# Patient Record
Sex: Female | Born: 1960 | Race: White | Hispanic: No | Smoking: Never smoker
Health system: Southern US, Community
[De-identification: ages and names within clinical notes are randomized; demographics above are authoritative.]

## PROBLEM LIST (undated history)

## (undated) DIAGNOSIS — I83893 Varicose veins of bilateral lower extremities with other complications: Secondary | ICD-10-CM

## (undated) DIAGNOSIS — G43909 Migraine, unspecified, not intractable, without status migrainosus: Secondary | ICD-10-CM

## (undated) DIAGNOSIS — N951 Menopausal and female climacteric states: Secondary | ICD-10-CM

## (undated) DIAGNOSIS — I83813 Varicose veins of bilateral lower extremities with pain: Secondary | ICD-10-CM

## (undated) DIAGNOSIS — N9489 Other specified conditions associated with female genital organs and menstrual cycle: Secondary | ICD-10-CM

## (undated) DIAGNOSIS — E785 Hyperlipidemia, unspecified: Secondary | ICD-10-CM

## (undated) DIAGNOSIS — L130 Dermatitis herpetiformis: Secondary | ICD-10-CM

## (undated) DIAGNOSIS — G4700 Insomnia, unspecified: Secondary | ICD-10-CM

## (undated) DIAGNOSIS — D649 Anemia, unspecified: Secondary | ICD-10-CM

## (undated) DIAGNOSIS — M8580 Other specified disorders of bone density and structure, unspecified site: Secondary | ICD-10-CM

## (undated) DIAGNOSIS — N200 Calculus of kidney: Secondary | ICD-10-CM

## (undated) DIAGNOSIS — J329 Chronic sinusitis, unspecified: Secondary | ICD-10-CM

## (undated) DIAGNOSIS — N3010 Interstitial cystitis (chronic) without hematuria: Secondary | ICD-10-CM

## (undated) HISTORY — DX: Other specified disorders of bone density and structure, unspecified site: M85.80

## (undated) HISTORY — DX: Insomnia, unspecified: G47.00

## (undated) HISTORY — DX: Menopausal and female climacteric states: N95.1

## (undated) HISTORY — DX: Calculus of kidney: N20.0

## (undated) HISTORY — PX: VEIN LIGATION AND STRIPPING: SHX2653

## (undated) HISTORY — DX: Hyperlipidemia, unspecified: E78.5

## (undated) HISTORY — DX: Anemia, unspecified: D64.9

## (undated) HISTORY — PX: VITRECTOMY: SHX106

## (undated) HISTORY — DX: Other specified conditions associated with female genital organs and menstrual cycle: N94.89

## (undated) HISTORY — DX: Migraine, unspecified, not intractable, without status migrainosus: G43.909

## (undated) HISTORY — PX: BREAST EXCISIONAL BIOPSY: SUR124

## (undated) HISTORY — PX: LAPAROSCOPIC OVARIAN: SHX5906

## (undated) HISTORY — PX: OTHER SURGICAL HISTORY: SHX169

## (undated) HISTORY — DX: Dermatitis herpetiformis: L13.0

## (undated) HISTORY — DX: Varicose veins of bilateral lower extremities with pain: I83.813

## (undated) HISTORY — DX: Chronic sinusitis, unspecified: J32.9

## (undated) HISTORY — DX: Varicose veins of bilateral lower extremities with other complications: I83.893

---

## 2012-08-19 DIAGNOSIS — R3915 Urgency of urination: Secondary | ICD-10-CM | POA: Insufficient documentation

## 2012-08-19 DIAGNOSIS — R350 Frequency of micturition: Secondary | ICD-10-CM | POA: Insufficient documentation

## 2012-08-19 DIAGNOSIS — R351 Nocturia: Secondary | ICD-10-CM | POA: Insufficient documentation

## 2012-09-16 DIAGNOSIS — R3589 Other polyuria: Secondary | ICD-10-CM | POA: Insufficient documentation

## 2012-09-16 DIAGNOSIS — R358 Other polyuria: Secondary | ICD-10-CM | POA: Insufficient documentation

## 2013-05-28 ENCOUNTER — Other Ambulatory Visit: Payer: Self-pay | Admitting: Family Medicine

## 2013-05-28 DIAGNOSIS — M79606 Pain in leg, unspecified: Secondary | ICD-10-CM

## 2013-05-28 DIAGNOSIS — M7989 Other specified soft tissue disorders: Secondary | ICD-10-CM

## 2013-06-04 ENCOUNTER — Ambulatory Visit
Admission: RE | Admit: 2013-06-04 | Discharge: 2013-06-04 | Disposition: A | Discharge status: Home / Self Care | Payer: No Typology Code available for payment source | Source: Ambulatory Visit | Attending: Family Medicine | Admitting: Family Medicine

## 2013-06-04 DIAGNOSIS — M7989 Other specified soft tissue disorders: Secondary | ICD-10-CM

## 2013-06-04 DIAGNOSIS — M79606 Pain in leg, unspecified: Secondary | ICD-10-CM

## 2014-04-18 ENCOUNTER — Emergency Department (HOSPITAL_COMMUNITY)
Admission: EM | Admit: 2014-04-18 | Discharge: 2014-04-18 | Disposition: A | Discharge status: Home / Self Care | Payer: BC Managed Care – PPO | Attending: Emergency Medicine | Admitting: Emergency Medicine

## 2014-04-18 ENCOUNTER — Encounter (HOSPITAL_COMMUNITY): Payer: Self-pay | Admitting: *Deleted

## 2014-04-18 ENCOUNTER — Emergency Department (HOSPITAL_COMMUNITY): Payer: BC Managed Care – PPO

## 2014-04-18 DIAGNOSIS — R109 Unspecified abdominal pain: Secondary | ICD-10-CM | POA: Diagnosis present

## 2014-04-18 DIAGNOSIS — N201 Calculus of ureter: Secondary | ICD-10-CM | POA: Diagnosis not present

## 2014-04-18 HISTORY — DX: Interstitial cystitis (chronic) without hematuria: N30.10

## 2014-04-18 LAB — CBC WITH DIFFERENTIAL/PLATELET
Basophils Absolute: 0.1 10*3/uL (ref 0.0–0.1)
Basophils Relative: 1 % (ref 0–1)
Eosinophils Absolute: 0.2 10*3/uL (ref 0.0–0.7)
Eosinophils Relative: 2 % (ref 0–5)
HCT: 38.8 % (ref 36.0–46.0)
HEMOGLOBIN: 12.5 g/dL (ref 12.0–15.0)
LYMPHS ABS: 1.2 10*3/uL (ref 0.7–4.0)
LYMPHS PCT: 15 % (ref 12–46)
MCH: 30.3 pg (ref 26.0–34.0)
MCHC: 32.2 g/dL (ref 30.0–36.0)
MCV: 93.9 fL (ref 78.0–100.0)
MONOS PCT: 9 % (ref 3–12)
Monocytes Absolute: 0.7 10*3/uL (ref 0.1–1.0)
Neutro Abs: 5.9 10*3/uL (ref 1.7–7.7)
Neutrophils Relative %: 73 % (ref 43–77)
PLATELETS: 200 10*3/uL (ref 150–400)
RBC: 4.13 MIL/uL (ref 3.87–5.11)
RDW: 12.9 % (ref 11.5–15.5)
WBC: 8 10*3/uL (ref 4.0–10.5)

## 2014-04-18 LAB — COMPREHENSIVE METABOLIC PANEL
ALBUMIN: 4.2 g/dL (ref 3.5–5.2)
ALT: 15 U/L (ref 0–35)
ANION GAP: 13 (ref 5–15)
AST: 20 U/L (ref 0–37)
Alkaline Phosphatase: 56 U/L (ref 39–117)
BUN: 20 mg/dL (ref 6–23)
CALCIUM: 9.6 mg/dL (ref 8.4–10.5)
CO2: 27 mEq/L (ref 19–32)
CREATININE: 0.64 mg/dL (ref 0.50–1.10)
Chloride: 105 mEq/L (ref 96–112)
GFR calc Af Amer: >90 mL/min (ref >90)
GFR calc non Af Amer: >90 mL/min (ref >90)
Glucose, Bld: 96 mg/dL (ref 70–99)
Potassium: 4.2 mEq/L (ref 3.7–5.3)
Sodium: 145 mEq/L (ref 137–147)
TOTAL PROTEIN: 7.2 g/dL (ref 6.0–8.3)
Total Bilirubin: 0.4 mg/dL (ref 0.3–1.2)

## 2014-04-18 LAB — URINALYSIS, ROUTINE W REFLEX MICROSCOPIC
BILIRUBIN URINE: NEGATIVE
Glucose, UA: NEGATIVE mg/dL
Hgb urine dipstick: NEGATIVE
Ketones, ur: NEGATIVE mg/dL
Leukocytes, UA: NEGATIVE
Nitrite: NEGATIVE
PH: 8 (ref 5.0–8.0)
Protein, ur: NEGATIVE mg/dL
SPECIFIC GRAVITY, URINE: 1.017 (ref 1.005–1.030)
UROBILINOGEN UA: 0.2 mg/dL (ref 0.0–1.0)

## 2014-04-18 LAB — URINE MICROSCOPIC-ADD ON

## 2014-04-18 LAB — I-STAT BETA HCG BLOOD, ED (MC, WL, AP ONLY)

## 2014-04-18 LAB — LIPASE, BLOOD: Lipase: 45 U/L (ref 11–59)

## 2014-04-18 MED ORDER — ONDANSETRON HCL 4 MG PO TABS: 4.0000 mg | ORAL_TABLET | Freq: Four times a day (QID) | ORAL | Status: DC

## 2014-04-18 MED ORDER — ONDANSETRON HCL 4 MG/2ML IJ SOLN
4.0000 mg | Freq: Once | INTRAMUSCULAR | Status: AC
  Administered 2014-04-18: 4 mg via INTRAVENOUS
  Filled 2014-04-18: qty 2

## 2014-04-18 MED ORDER — SODIUM CHLORIDE 0.9 % IV SOLN
1000.0000 mL | Freq: Once | INTRAVENOUS | Status: AC
  Administered 2014-04-18: 1000 mL via INTRAVENOUS

## 2014-04-18 MED ORDER — SODIUM CHLORIDE 0.9 % IV SOLN
1000.0000 mL | INTRAVENOUS | Status: DC
  Administered 2014-04-18: 1000 mL via INTRAVENOUS

## 2014-04-18 MED ORDER — HYDROMORPHONE HCL 1 MG/ML IJ SOLN
1.0000 mg | INTRAMUSCULAR | Status: DC | PRN
  Administered 2014-04-18: 1 mg via INTRAVENOUS
  Filled 2014-04-18: qty 1

## 2014-04-18 MED ORDER — KETOROLAC TROMETHAMINE 30 MG/ML IJ SOLN
30.0000 mg | Freq: Once | INTRAMUSCULAR | Status: AC
  Administered 2014-04-18: 30 mg via INTRAVENOUS
  Filled 2014-04-18: qty 1

## 2014-04-18 MED ORDER — OXYCODONE-ACETAMINOPHEN 5-325 MG PO TABS: 1.0000 | ORAL_TABLET | ORAL | Status: DC | PRN

## 2014-04-18 NOTE — ED Notes (Signed)
Patient states she began having left flank pain this morning with some "tinges" of pain in the lower abdomen/bladder area during the night.  Patient states the pain radiates into her groin and hip.  Patient denies dysuria and hematuria.  Patient has a hx of IC and states the abdominal pain is similar, but she has never had the flank pain before.  Patient endorse urinary urgency and frequency.  Patient states she is able to pass urine, but the stream is weak.

## 2014-04-18 NOTE — Discharge Instructions (Signed)

## 2014-04-18 NOTE — ED Provider Notes (Signed)
CSN: [id number]     Arrival date & time 04/18/14  1129 History   First MD Initiated Contact with Patient 04/18/14 1231     Chief Complaint  Patient presents with  . Flank Pain    left   HPI Patient presents to the emergency room with complaints of severe left flank pain radiating towards her anterior abdomen and down her leg. She initially noticed some urinary discomfort associated with suprapubic discomfort last evening. However she denies burning or hematuria. She does have a history of interstitial cystitis with an extensive workup in the past with those findings were not surprising. However this morning she started having the severe pain. She has never had anything like this before. Pain is colicky. At times it is similar to labor pains. She is unable to find a comfortable position. Pain does feel like it goes towards her leg. No vomiting or diarrhea. No fevers or constipation. Past Medical History  Diagnosis Date  . Interstitial cystitis    Past Surgical History  Procedure Laterality Date  . Laparascopy     No family history on file. History  Substance Use Topics  . Smoking status: Never Smoker   . Smokeless tobacco: Never Used  . Alcohol Use: Yes     Comment: a couple glasses of wine or beer a week   OB History    No data available     Review of Systems  All other systems reviewed and are negative.     Allergies  Erythromycin  Home Medications   Prior to Admission medications   Medication Sig Start Date End Date Taking? Authorizing Provider  PREMARIN vaginal cream Place 1 Applicatorful vaginally 2 (two) times a week.  01/24/14  Yes Historical Provider, MD  ondansetron (ZOFRAN) 4 MG tablet Take 1 tablet (4 mg total) by mouth every 6 (six) hours. 04/18/14   Linwood DibblesJon Anastasios Melander, MD  oxyCODONE-acetaminophen (ROXICET) 5-325 MG per tablet Take 1-2 tablets by mouth every 4 (four) hours as needed for severe pain. 04/18/14   Linwood DibblesJon Elah Avellino, MD   BP 122/72 mmHg  Pulse 65  Temp(Src) 97.6  F (36.4 C) (Oral)  Resp 17  Ht 5\' 11"  (1.803 m)  Wt 137 lb (62.143 kg)  BMI 19.12 kg/m2  SpO2 100% Physical Exam  Constitutional: She appears well-developed and well-nourished. She appears distressed.  HENT:  Head: Normocephalic and atraumatic.  Right Ear: External ear normal.  Left Ear: External ear normal.  Eyes: Conjunctivae are normal. Right eye exhibits no discharge. Left eye exhibits no discharge. No scleral icterus.  Neck: Neck supple. No tracheal deviation present.  Cardiovascular: Normal rate, regular rhythm and intact distal pulses.   Pulmonary/Chest: Effort normal and breath sounds normal. No stridor. No respiratory distress. She has no wheezes. She has no rales.  Abdominal: Soft. Bowel sounds are normal. She exhibits no distension. There is no tenderness. There is CVA tenderness (left). There is no rebound and no guarding.  Musculoskeletal: She exhibits no edema or tenderness.  Neurological: She is alert. She has normal strength. No cranial nerve deficit (no facial droop, extraocular movements intact, no slurred speech) or sensory deficit. She exhibits normal muscle tone. She displays no seizure activity. Coordination normal.  Skin: Skin is warm and dry. No rash noted. She is not diaphoretic.  Psychiatric: She has a normal mood and affect.  Nursing note and vitals reviewed.   ED Course  Procedures (including critical care time) Labs Review Labs Reviewed  URINALYSIS, ROUTINE W REFLEX MICROSCOPIC -  Abnormal; Notable for the following:    APPearance TURBID (*)    All other components within normal limits  CBC WITH DIFFERENTIAL  COMPREHENSIVE METABOLIC PANEL  LIPASE, BLOOD  URINE MICROSCOPIC-ADD ON  I-STAT BETA HCG BLOOD, ED ([facility], [facility], [facility] ONLY)    Imaging Review Ct Abdomen Pelvis Wo Contrast  04/18/2014   CLINICAL DATA:  53 year old female with left flank pain radiating to the groin  EXAM: CT ABDOMEN AND PELVIS WITHOUT CONTRAST  TECHNIQUE: Multidetector CT imaging of  the abdomen and pelvis was performed following the standard protocol without IV contrast.  COMPARISON:  None.  FINDINGS: Lower Chest: The lung bases are clear. Visualized cardiac structures are within normal limits for size. No pericardial effusion. Unremarkable visualized distal thoracic esophagus.  Abdomen: Unenhanced CT was performed per clinician order. Lack of IV contrast limits sensitivity and specificity, especially for evaluation of abdominal/pelvic solid viscera. Within these limitations, unremarkable CT appearance of the stomach, duodenum, spleen, adrenal glands and pancreas. Normal hepatic contour and morphology. No discrete hepatic lesion. Gallbladder is unremarkable. No intra or extrahepatic biliary ductal dilatation. Unremarkable appearance of the right kidney. Mild left hydroureteronephrosis. No nephrolithiasis within either kidney. No focal bowel wall thickening or evidence of bowel obstruction. Normal appendix in the right lower quadrant. No free fluid or suspicious adenopathy.  Pelvis: 4 mm stone within the bladder. This likely represents a recently passed renal stone. Unremarkable appearance of the uterus and adnexa.  Bones/Soft Tissues: No acute fracture or aggressive appearing lytic or blastic osseous lesion.  Vascular: Incidental note is made of prominence of the right great saphenous vein with multiple superficial venous varicosities. Additionally, there appear to be labial varicosities and prominence of the left ovarian vein. Evaluation is limited in the absence of intravenous contrast.  IMPRESSION: 1. There is a 4 mm stone in the region of the left UVJ. This may be just within the UVJ or recently passed into the bladder. There is mild residual left hydroureteronephrosis. 2. No additional nephrolithiasis identified. 3. Enlarged right great saphenous vein with multiple superficial venous varicosities in the medial upper thigh. Recommend clinical correlation for signs and symptoms of venous  insufficiency. If clinically symptomatic, consider referral to Interventional Radiology for further evaluation of venous insufficiency. 4. Additionally, the left gonadal vein is enlarged and there are evolve are varicosities bilaterally. This may represent incompetence of the left ovarian vein which can be associated with the clinical symptoms of pelvic congestion syndrome. Interventional Radiology could also further evaluate for this constellation of symptoms.   Electronically Signed   By: Malachy MoanHeath  McCullough M.D.   On: 04/18/2014 14:40   Medications  0.9 %  sodium chloride infusion (0 mLs Intravenous Stopped 04/18/14 1349)    Followed by  0.9 %  sodium chloride infusion (1,000 mLs Intravenous New Bag/Given 04/18/14 1358)  HYDROmorphone (DILAUDID) injection 1 mg (1 mg Intravenous Given 04/18/14 1300)  ondansetron (ZOFRAN) injection 4 mg (not administered)  ketorolac (TORADOL) 30 MG/ML injection 30 mg (not administered)  ondansetron (ZOFRAN) injection 4 mg (4 mg Intravenous Given 04/18/14 1300)     MDM   Final diagnoses:  Ureteral stone    Pt improved after treatment in the ED.  Will dc home with urine strainer and pain meds.  Outpatient urology follow up  Discussed varicose vein findings in the leg and gonadal vein with patient.  She is aware of the varicose veins in her leg.  Will follow up with an OB GYN    Linwood DibblesJon Perfecto Purdy, MD  04/18/14 1524 

## 2014-05-04 ENCOUNTER — Other Ambulatory Visit: Payer: Self-pay

## 2014-05-04 ENCOUNTER — Other Ambulatory Visit: Payer: Self-pay | Admitting: Family Medicine

## 2014-05-04 ENCOUNTER — Other Ambulatory Visit (HOSPITAL_COMMUNITY)
Admission: RE | Admit: 2014-05-04 | Discharge: 2014-05-04 | Disposition: A | Discharge status: Home / Self Care | Payer: BC Managed Care – PPO | Source: Ambulatory Visit | Attending: Family Medicine | Admitting: Family Medicine

## 2014-05-04 DIAGNOSIS — Z124 Encounter for screening for malignant neoplasm of cervix: Secondary | ICD-10-CM | POA: Diagnosis present

## 2014-05-04 DIAGNOSIS — N6320 Unspecified lump in the left breast, unspecified quadrant: Secondary | ICD-10-CM

## 2014-05-05 ENCOUNTER — Other Ambulatory Visit: Payer: Self-pay | Admitting: Family Medicine

## 2014-05-05 DIAGNOSIS — N6320 Unspecified lump in the left breast, unspecified quadrant: Secondary | ICD-10-CM

## 2014-05-05 LAB — CYTOLOGY - PAP

## 2014-05-19 ENCOUNTER — Ambulatory Visit
Admission: RE | Admit: 2014-05-19 | Discharge: 2014-05-19 | Disposition: A | Discharge status: Home / Self Care | Payer: BC Managed Care – PPO | Source: Ambulatory Visit | Attending: Family Medicine | Admitting: Family Medicine

## 2014-05-19 ENCOUNTER — Other Ambulatory Visit: Payer: Self-pay | Admitting: Family Medicine

## 2014-05-19 DIAGNOSIS — N6320 Unspecified lump in the left breast, unspecified quadrant: Secondary | ICD-10-CM

## 2014-05-28 ENCOUNTER — Other Ambulatory Visit: Payer: BC Managed Care – PPO

## 2014-06-10 ENCOUNTER — Ambulatory Visit
Admission: RE | Admit: 2014-06-10 | Discharge: 2014-06-10 | Disposition: A | Discharge status: Home / Self Care | Payer: BC Managed Care – PPO | Source: Ambulatory Visit | Attending: Family Medicine | Admitting: Family Medicine

## 2014-06-10 ENCOUNTER — Other Ambulatory Visit: Payer: Self-pay | Admitting: Family Medicine

## 2014-06-10 DIAGNOSIS — N6320 Unspecified lump in the left breast, unspecified quadrant: Secondary | ICD-10-CM

## 2014-06-11 ENCOUNTER — Other Ambulatory Visit: Payer: Self-pay | Admitting: Family Medicine

## 2014-06-11 DIAGNOSIS — N6489 Other specified disorders of breast: Secondary | ICD-10-CM

## 2014-06-24 ENCOUNTER — Ambulatory Visit
Admission: RE | Admit: 2014-06-24 | Discharge: 2014-06-24 | Disposition: A | Discharge status: Home / Self Care | Payer: BC Managed Care – PPO | Source: Ambulatory Visit | Attending: Family Medicine | Admitting: Family Medicine

## 2014-06-24 DIAGNOSIS — N6489 Other specified disorders of breast: Secondary | ICD-10-CM

## 2014-06-24 MED ORDER — GADOBENATE DIMEGLUMINE 529 MG/ML IV SOLN
12.0000 mL | Freq: Once | INTRAVENOUS | Status: AC | PRN
  Administered 2014-06-24: 12 mL via INTRAVENOUS

## 2014-06-29 ENCOUNTER — Other Ambulatory Visit: Payer: Self-pay | Admitting: Family Medicine

## 2014-06-29 DIAGNOSIS — R928 Other abnormal and inconclusive findings on diagnostic imaging of breast: Secondary | ICD-10-CM

## 2014-08-26 ENCOUNTER — Other Ambulatory Visit: Payer: Self-pay | Admitting: Family Medicine

## 2014-08-26 DIAGNOSIS — R928 Other abnormal and inconclusive findings on diagnostic imaging of breast: Secondary | ICD-10-CM

## 2014-08-30 ENCOUNTER — Ambulatory Visit
Admission: RE | Admit: 2014-08-30 | Discharge: 2014-08-30 | Disposition: A | Discharge status: Home / Self Care | Payer: BC Managed Care – PPO | Source: Ambulatory Visit | Attending: Family Medicine | Admitting: Family Medicine

## 2014-08-30 DIAGNOSIS — R928 Other abnormal and inconclusive findings on diagnostic imaging of breast: Secondary | ICD-10-CM

## 2015-01-05 ENCOUNTER — Other Ambulatory Visit: Payer: Self-pay | Admitting: Family Medicine

## 2015-01-05 DIAGNOSIS — R928 Other abnormal and inconclusive findings on diagnostic imaging of breast: Secondary | ICD-10-CM

## 2015-04-18 ENCOUNTER — Other Ambulatory Visit: Payer: Self-pay | Admitting: Family Medicine

## 2015-04-18 DIAGNOSIS — R928 Other abnormal and inconclusive findings on diagnostic imaging of breast: Secondary | ICD-10-CM

## 2015-04-18 DIAGNOSIS — N6489 Other specified disorders of breast: Secondary | ICD-10-CM

## 2015-04-27 ENCOUNTER — Ambulatory Visit
Admission: RE | Admit: 2015-04-27 | Discharge: 2015-04-27 | Disposition: A | Discharge status: Home / Self Care | Payer: BC Managed Care – PPO | Source: Ambulatory Visit | Attending: Family Medicine | Admitting: Family Medicine

## 2015-04-27 DIAGNOSIS — N6489 Other specified disorders of breast: Secondary | ICD-10-CM

## 2015-04-27 DIAGNOSIS — R928 Other abnormal and inconclusive findings on diagnostic imaging of breast: Secondary | ICD-10-CM

## 2016-03-22 ENCOUNTER — Other Ambulatory Visit: Payer: Self-pay | Admitting: Family Medicine

## 2016-03-22 DIAGNOSIS — Z1231 Encounter for screening mammogram for malignant neoplasm of breast: Secondary | ICD-10-CM

## 2016-04-09 DIAGNOSIS — J310 Chronic rhinitis: Secondary | ICD-10-CM | POA: Insufficient documentation

## 2016-04-09 DIAGNOSIS — J342 Deviated nasal septum: Secondary | ICD-10-CM | POA: Insufficient documentation

## 2016-04-09 DIAGNOSIS — J0141 Acute recurrent pansinusitis: Secondary | ICD-10-CM | POA: Insufficient documentation

## 2016-05-07 HISTORY — PX: CATARACT EXTRACTION: SUR2

## 2016-05-15 ENCOUNTER — Other Ambulatory Visit: Payer: Self-pay | Admitting: Family Medicine

## 2016-05-15 ENCOUNTER — Other Ambulatory Visit (HOSPITAL_COMMUNITY)
Admission: RE | Admit: 2016-05-15 | Discharge: 2016-05-15 | Disposition: A | Discharge status: Home / Self Care | Payer: BC Managed Care – PPO | Source: Ambulatory Visit | Attending: Family Medicine | Admitting: Family Medicine

## 2016-05-15 DIAGNOSIS — Z01411 Encounter for gynecological examination (general) (routine) with abnormal findings: Secondary | ICD-10-CM | POA: Diagnosis present

## 2016-05-15 DIAGNOSIS — Z1151 Encounter for screening for human papillomavirus (HPV): Secondary | ICD-10-CM | POA: Diagnosis not present

## 2016-05-16 ENCOUNTER — Other Ambulatory Visit: Payer: Self-pay | Admitting: Family Medicine

## 2016-05-16 LAB — CYTOLOGY - PAP
Diagnosis: NEGATIVE
HPV (WINDOPATH): NOT DETECTED

## 2017-06-11 ENCOUNTER — Telehealth: Payer: Self-pay | Admitting: *Deleted

## 2017-06-11 NOTE — Telephone Encounter (Signed)
Referral sent to scheduling. 

## 2017-08-09 ENCOUNTER — Encounter: Payer: Self-pay | Admitting: Interventional Cardiology

## 2017-08-09 ENCOUNTER — Ambulatory Visit: Payer: BC Managed Care – PPO | Admitting: Interventional Cardiology

## 2017-08-09 VITALS — BP 102/68 | HR 61 | Ht 71.0 in | Wt 149.0 lb

## 2017-08-09 DIAGNOSIS — I341 Nonrheumatic mitral (valve) prolapse: Secondary | ICD-10-CM

## 2017-08-09 DIAGNOSIS — R072 Precordial pain: Secondary | ICD-10-CM

## 2017-08-09 DIAGNOSIS — R002 Palpitations: Secondary | ICD-10-CM | POA: Diagnosis not present

## 2017-08-09 NOTE — Progress Notes (Signed)
Cardiology Office Note   Date:  08/09/2017   ID:  Michelle HewsSusan Hodge, DOB March 06, 1961, MRN [id number]  PCP:  Shirlean MylarWebb, Carol, MD    No chief complaint on file.  Chest pain  Wt Readings from Last 3 Encounters:  08/09/17 149 lb (67.6 kg)  04/18/14 137 lb (62.1 kg)       History of Present Illness: Michelle Hodge is a 57 y.o. female who is being seen today for the evaluation of chest pain, nausea at the request of Shirlean MylarWebb, Carol, MD.  She had episodes when she went hiking twice, where she had a "bubbling feeling" in her chest, nausea, and dizziness just after exercise.  She has dizziness when she stands up.  She has a h/o MVP many years ago.    She is afraid to go back to exercise.    No prior invasive heart testing.  No recent echocardiogram.    Past Medical History:  Diagnosis Date  . Anemia, unspecified   . Dermatitis herpetiformis   . Female climacteric state   . Hyperlipidemia   . Insomnia   . Interstitial cystitis   . Menopausal state   . Migraines   . Nephrolithiasis   . Osteopenia   . Pelvic congestion syndrome   . Sinusitis   . 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 Varicose veins of both lower extremities with pain     Past Surgical History:  Procedure Laterality Date  . CATARACT EXTRACTION  2018  . laparascopy    . LAPAROSCOPIC OVARIAN    . VEIN LIGATION AND STRIPPING    . VITRECTOMY Bilateral 6/17-8/17     Current Outpatient Medications  Medication Sig Dispense Refill  . Butalbital-APAP-Caff-Cod (FIORICET/CODEINE) 50-300-40-30 MG CAPS Take by mouth.    . zaleplon (SONATA) 10 MG capsule Take 10 mg by mouth at bedtime as needed for sleep.     No current facility-administered medications for this visit.     Allergies:   Amitriptyline and Erythromycin    Social History:  The patient  reports that she has never smoked. She has never used smokeless tobacco. She reports that she drinks alcohol.   Family History:  The  patient's family history includes AAA (abdominal aortic aneurysm) in her brother; Atrial fibrillation in her mother; Cancer in her brother and brother; Colon polyps in her father and mother; Diabetes in her brother; Hypertension in her brother and mother; Lymphoma in her father; Obesity in her brother; Retinal detachment in her brother; Stroke in her brother.    ROS:  Please see the history of present illness.   Otherwise, review of systems are positive for leg swelling/varicose veins causing right leg swelling; "pelvic congestive syndrome"; palpitations.   All other systems are reviewed and negative.    PHYSICAL EXAM: VS:  BP 102/68   Pulse 61   Ht 5\' 11"  (1.803 m)   Wt 149 lb (67.6 kg)   SpO2 98%   BMI 20.78 kg/m  , BMI Body mass index is 20.78 kg/m. GEN: Well nourished, well developed, in no acute distress  HEENT: normal  Neck: no JVD, carotid bruits, or masses Cardiac: RRR; no murmurs, rubs, or gallops,no edema  Respiratory:  clear to auscultation bilaterally, normal work of breathing GI: soft, nontender, nondistended, + BS MS: no deformity or atrophy  Skin: warm and dry, no rash Neuro:  Strength and sensation are intact Psych: euthymic mood, full affect   EKG:   The ekg ordered  today demonstrates normal ECG   Recent Labs: No results found for requested labs within last 8760 hours.   Lipid Panel No results found for: CHOL, TRIG, HDL, CHOLHDL, VLDL, LDLCALC, LDLDIRECT   Other studies Reviewed: Additional studies/ records that were reviewed today with results demonstrating: LDL 118 in Jan 2018.   ASSESSMENT AND PLAN:  1. Chest pain: This occurs more after exercise then during exercise.  Will plan for exercise treadmill test to see if she is having some arrhythmia or any sign of ischemia. 2. Palpitations: Some of what she describes sounds more like skipped beats.  Can consider Holter monitor if symptoms persist. 3. History of mitral valve prolapse: Consider  echocardiogram as well if symptoms persist or if there are significant symptoms on the treadmill.  By exam, I do not hear any significant mitral regurgitation and there is certainly no evidence of congestive heart failure.   Current medicines are reviewed at length with the patient today.  The patient concerns regarding her medicines were addressed.  The following changes have been made:  No change  Labs/ tests ordered today include:  No orders of the defined types were placed in this encounter.   Recommend 150 minutes/week of aerobic exercise Low fat, low carb, high fiber diet recommended  Disposition:   FU for ETT   Signed, Lance Muss, MD  08/09/2017 11:31 AM    [facility] [address] [place], [place], [place]  [address] Phone: [phone]; Fax: [phone]

## 2017-08-09 NOTE — Patient Instructions (Signed)
Medication Instructions:  Your physician recommends that you continue on your current medications as directed. Please refer to the Current Medication list given to you today.   Labwork: None ordered  Testing/Procedures: Your physician has requested that you have an exercise tolerance test. For further information please visit [email or web address]. Please also follow instruction sheet, as given.  Follow-Up: Based on test results   Any Other Special Instructions Will Be Listed Below (If Applicable).   Exercise Stress Electrocardiogram An exercise stress electrocardiogram is a test to check how blood flows to your heart. It is done to find areas of poor blood flow. You will need to walk on a treadmill for this test. The electrocardiogram will record your heartbeat when you are at rest and when you are exercising. What happens before the procedure?  Do not have drinks with caffeine or foods with caffeine for 24 hours before the test, or as told by your doctor. This includes coffee, tea (even decaf tea), sodas, chocolate, and cocoa.  Follow your doctor's instructions about eating and drinking before the test.  Ask your doctor what medicines you should or should not take before the test. Take your medicines with water unless told by your doctor not to.  If you use an inhaler, bring it with you to the test.  Bring a snack to eat after the test.  Do not  smoke for 4 hours before the test.  Do not put lotions, powders, creams, or oils on your chest before the test.  Wear comfortable shoes and clothing. What happens during the procedure?  You will have patches put on your chest. Small areas of your chest may need to be shaved. Wires will be connected to the patches.  Your heart rate will be watched while you are resting and while you are exercising.  You will walk on the treadmill. The treadmill will slowly get faster to raise your heart rate.  The test will take about 1-2  hours. What happens after the procedure?  Your heart rate and blood pressure will be watched after the test.  You may return to your normal diet, activities, and medicines or as told by your doctor. This information is not intended to replace advice given to you by your health care provider. Make sure you discuss any questions you have with your health care provider. Document Released: 10/10/2007 Document Revised: 12/21/2015 Document Reviewed: 12/29/2012 Elsevier Interactive Patient Education  2018 Elsevier Inc.    If you need a refill on your cardiac medications before your next appointment, please call your pharmacy.   

## 2017-08-23 ENCOUNTER — Ambulatory Visit (INDEPENDENT_AMBULATORY_CARE_PROVIDER_SITE_OTHER): Payer: BC Managed Care – PPO

## 2017-08-23 DIAGNOSIS — R072 Precordial pain: Secondary | ICD-10-CM | POA: Diagnosis not present

## 2017-08-23 LAB — EXERCISE TOLERANCE TEST
CHL CUP RESTING HR STRESS: 62 {beats}/min
Estimated workload: 7 METS
Exercise duration (min): 6 min
Exercise duration (sec): 0 s
MPHR: 165 {beats}/min
Peak HR: 210 {beats}/min
Percent HR: 127 %
RPE: 16

## 2017-08-26 ENCOUNTER — Other Ambulatory Visit: Payer: Self-pay

## 2017-08-27 ENCOUNTER — Other Ambulatory Visit: Payer: Self-pay

## 2017-08-27 DIAGNOSIS — R9439 Abnormal result of other cardiovascular function study: Secondary | ICD-10-CM

## 2017-09-03 ENCOUNTER — Encounter: Payer: Self-pay | Admitting: Internal Medicine

## 2017-09-03 ENCOUNTER — Ambulatory Visit: Payer: BC Managed Care – PPO | Admitting: Internal Medicine

## 2017-09-03 VITALS — BP 100/60 | HR 62 | Ht 70.0 in | Wt 148.0 lb

## 2017-09-03 DIAGNOSIS — I471 Supraventricular tachycardia: Secondary | ICD-10-CM

## 2017-09-03 DIAGNOSIS — R9439 Abnormal result of other cardiovascular function study: Secondary | ICD-10-CM | POA: Diagnosis not present

## 2017-09-03 NOTE — Progress Notes (Signed)
ELECTROPHYSIOLOGY CONSULT NOTE  Patient ID: Michelle Hodge, MRN: [id number], DOB/AGE: June 14, 1960 57 y.o. Admit date: (Not on file) Date of Consult: 09/03/2017  Primary Physician: Shirlean Mylar, MD Primary Cardiologist: Hedy Jacob     Michelle Hodge is a 57 y.o. female who is being seen today for the evaluation of SVT at the request of JV.    HPI Michelle Hodge is a 57 y.o. female  With a long but poorly clarified history of recurrent tachypalpitations.  They are frequently triggered by exercise.  More recently they have become increasingly symptomatic associated with lightheadedness bordering on presyncope, chest discomfort,  Diaphoresis.  Mostly they have been relatively brief.  One was particularly symptomatic and she thought it lasted longer.  (See below)  More recently she has been introduced to the bagel maneuver and was able to terminate 1 of her episodes with Valsalva.  She has a history of orthostatic intolerance.  She has joint laxity.  She was told years ago that she had mitral valve prolapse.  She has venous discoloration upon standing and shower/heat intolerance.  She does not have a history of syncope.  She has a history of venous insufficiency.  She had superficial thrombophlebitis following the birth of her first child, Rutherford Nail, resulting in stripping.  She then was found to have "pelvic congestion syndrome "in which she had nephrolithiasis a found other evidence of venous abnormalities.  She normally is quite fit.  Most recent symptoms developed after hiking.       Past Medical History:  Diagnosis Date  . Anemia, unspecified   . Dermatitis herpetiformis   . Female climacteric state   . Hyperlipidemia   . Insomnia   . Interstitial cystitis   . Menopausal state   . Migraines   . Nephrolithiasis   . Osteopenia   . Pelvic congestion syndrome   . Sinusitis   . 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 Varicose veins of both lower  extremities with pain       Surgical History:  Past Surgical History:  Procedure Laterality Date  . CATARACT EXTRACTION  2018  . laparascopy    . LAPAROSCOPIC OVARIAN    . VEIN LIGATION AND STRIPPING    . VITRECTOMY Bilateral 6/17-8/17     Home Meds: Prior to Admission medications   Medication Sig Start Date End Date Taking? Authorizing Provider  Butalbital-APAP-Caff-Cod (FIORICET/CODEINE) 50-300-40-30 MG CAPS Take by mouth.   Yes [provider]  zaleplon (SONATA) 10 MG capsule Take 10 mg by mouth at bedtime as needed for sleep.   Yes [provider]    Allergies:  Allergies  Allergen Reactions  . Amitriptyline Other (See Comments)  . Erythromycin Nausea And Vomiting    Social History   Socioeconomic History  . Marital status: Single    Spouse name: Not on file  . Number of children: Not on file  . Years of education: Not on file  . Highest education level: Not on file  Occupational History  . Not on file  Social Needs  . Financial resource strain: Not on file  . Food insecurity:    Worry: Not on file    Inability: Not on file  . Transportation needs:    Medical: Not on file    Non-medical: Not on file  Tobacco Use  . Smoking status: Never Smoker  . Smokeless tobacco: Never Used  Substance and Sexual Activity  . Alcohol use: Yes  Comment: a couple glasses of wine or beer a week  . Drug use: Not on file  . Sexual activity: Not on file  Lifestyle  . Physical activity:    Days per week: Not on file    Minutes per session: Not on file  . Stress: Not on file  Relationships  . Social connections:    Talks on phone: Not on file    Gets together: Not on file    Attends religious service: Not on file    Active member of club or organization: Not on file    Attends meetings of clubs or organizations: Not on file    Relationship status: Not on file  . Intimate partner violence:    Fear of current or ex partner: Not on file    Emotionally  abused: Not on file    Physically abused: Not on file    Forced sexual activity: Not on file  Other Topics Concern  . Not on file  Social History Narrative  . Not on file     Family History  Problem Relation Age of Onset  . Colon polyps Mother   . Atrial fibrillation Mother   . Hypertension Mother   . Lymphoma Father   . Colon polyps Father   . Cancer Brother        BLADDER  . Stroke Brother   . Retinal detachment Brother   . AAA (abdominal aortic aneurysm) Brother   . Obesity Brother   . Cancer Brother        CARCINOID  . Diabetes Brother   . Hypertension Brother      ROS:  Please see the history of present illness.     All other systems reviewed and negative.    Physical Exam: Blood pressure 100/60, pulse 62, height  (1.778 m), weight 148 lb (67.1 kg), SpO2 98 %. General: Well developed, well nourished female in no acute distress. Head: Normocephalic, atraumatic, sclera non-icteric, no xanthomas, nares are without discharge. EENT: normal  Lymph Nodes:  none Neck: Negative for carotid bruits. JVD not elevated. Back:without scoliosis kyphosis Lungs: Clear bilaterally to auscultation without wheezes, rales, or rhonchi. Breathing is unlabored. Heart: RRR with S1 S2.  2/6 systolic murmur . No rubs, or gallops appreciated. Abdomen: Soft, non-tender, non-distended with normoactive bowel sounds. No hepatomegaly. No rebound/guarding. No obvious abdominal masses. Msk:  Strength and tone appear normal for age. Extremities: No clubbing or cyanosis. No edema.  Distal pedal pulses are 2+ and equal bilaterally. Skin: Warm and Dry Neuro: Alert and oriented X 3. CN III-XII intact Grossly normal sensory and motor function . Psych:  Responds to questions appropriately with a normal affect.      Labs: Cardiac Enzymes No results for input(s): CKTOTAL, CKMB, TROPONINI in the last 72 hours. CBC Lab Results  Component Value Date   WBC 8.0 04/18/2014   HGB 12.5 04/18/2014    HCT 38.8 04/18/2014   MCV 93.9 04/18/2014   PLT 200 04/18/2014   PROTIME: No results for input(s): LABPROT, INR in the last 72 hours. Chemistry No results for input(s): NA, K, CL, CO2, BUN, CREATININE, CALCIUM, PROT, BILITOT, ALKPHOS, ALT, AST, GLUCOSE in the last 168 hours.  Invalid input(s): LABALBU Lipids No results found for: CHOL, HDL, LDLCALC, TRIG BNP No results found for: PROBNP Thyroid Function Tests: No results for input(s): TSH, T4TOTAL, T3FREE, THYROIDAB in the last 72 hours.  Invalid input(s): FREET3 Miscellaneous No results found for: DDIMER  Radiology/Studies:  No  results found.  EKG:  Multiple ECGs were reviewed.  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  We reviewed strategies including nonpharmacological termination with vagal maneuvers, AV nodal blocking agents which may be precluded by her propensity towards hypotension, antiarrhythmic drugs which are associated with some potential risk of proarrhythmia albeit small and catheter ablation.  We reviewed that the tachycardia is a lifestyle issue given the fact that it is not an of itself life-threatening.  His symptoms may be sufficiently problematic and I worry that as she has gotten older her symptoms are worsening and we would anticipate that this might continue.  For now she would like to use the vagal maneuvers.  With her history of joint laxity and tachycardia,  it is appropriate to exclude mitral valve prolapse.  We will undertake an echocardiogram.  We reviewed also the physiology of dysautonomia the value of salt and water repletion and its implications to the symptoms associated with the onset of tachycardia  In the event that she proceed with catheter ablation, we will need to further understand the implications of her pelvic venous varicosities.  I suspect that they have no implications on the central veins through which catheter ablation would be pursued.  We also reviewed the chest pain issue.  I suspect she has a low likelihood of having coronary disease.  Rather than undertaking stress imaging as suggested, we have elected to pursue calcium scoring   More than 50% of 85 min was spent in counseling related to the above    Sherryl Manges

## 2017-09-03 NOTE — Patient Instructions (Addendum)
Medication Instructions:  Your physician recommends that you continue on your current medications as directed. Please refer to the Current Medication list given to you today.  Labwork: None ordered.  Testing/Procedures:  Calcium Scoring CT-- See attached documents on education on Ca Scoring CT. This test will be a $150 flat fee.  Your physician has requested that you have an echocardiogram. Echocardiography is a painless test that uses sound waves to create images of your heart. It provides your doctor with information about the size and shape of your heart and how well your heart's chambers and valves are working. This procedure takes approximately one hour. There are no restrictions for this procedure.   Follow-Up: Your physician recommends that you schedule a follow-up appointment in:   3 Months with Dr Graciela Husbands  Any Other Special Instructions Will Be Listed Below (If Applicable).  [employer]  FDA-cleared EKG at your fingertips. - [employer]   [employer]. https://store.alivecor.com/products/kardiamobile   FDA-cleared, clinical grade mobile EKG monitor: Lourena Simmonds is the most clinically-validated mobile EKG used by the world's leading cardiac care medical professionals.  This may be useful in monitoring palpitations.  We do not have access to have them emailed and reviewed but will be glad to review while in the office.       If you need a refill on your cardiac medications before your next appointment, please call your pharmacy.

## 2017-09-12 ENCOUNTER — Other Ambulatory Visit: Payer: Self-pay

## 2017-09-12 ENCOUNTER — Ambulatory Visit (INDEPENDENT_AMBULATORY_CARE_PROVIDER_SITE_OTHER)
Admission: RE | Admit: 2017-09-12 | Discharge: 2017-09-12 | Disposition: A | Discharge status: Home / Self Care | Payer: Self-pay | Source: Ambulatory Visit | Attending: Internal Medicine | Admitting: Internal Medicine

## 2017-09-12 ENCOUNTER — Ambulatory Visit (HOSPITAL_COMMUNITY): Payer: BC Managed Care – PPO | Attending: Internal Medicine

## 2017-09-12 DIAGNOSIS — D649 Anemia, unspecified: Secondary | ICD-10-CM | POA: Diagnosis not present

## 2017-09-12 DIAGNOSIS — R9439 Abnormal result of other cardiovascular function study: Secondary | ICD-10-CM

## 2017-09-12 DIAGNOSIS — I471 Supraventricular tachycardia: Secondary | ICD-10-CM | POA: Diagnosis not present

## 2017-12-23 ENCOUNTER — Ambulatory Visit: Payer: BC Managed Care – PPO | Admitting: Internal Medicine

## 2017-12-23 ENCOUNTER — Encounter: Payer: Self-pay | Admitting: Internal Medicine

## 2017-12-23 VITALS — BP 100/68 | HR 61 | Ht 71.0 in | Wt 145.4 lb

## 2017-12-23 DIAGNOSIS — R072 Precordial pain: Secondary | ICD-10-CM | POA: Diagnosis not present

## 2017-12-23 DIAGNOSIS — I471 Supraventricular tachycardia, unspecified: Secondary | ICD-10-CM

## 2017-12-23 DIAGNOSIS — R600 Localized edema: Secondary | ICD-10-CM | POA: Diagnosis not present

## 2017-12-23 NOTE — Progress Notes (Signed)
      Patient Care Team: Shirlean MylarWebb, Carol, MD as PCP - General (Family Medicine)   HPI  Michelle Hodge is a 57 y.o. 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 Anemia, unspecified   . Dermatitis herpetiformis   . Female climacteric state   . Hyperlipidemia   . Insomnia   . Interstitial cystitis   . Menopausal state   . Migraines   . Nephrolithiasis   . Osteopenia   . Pelvic congestion syndrome   . Sinusitis   . 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 Varicose veins of both lower extremities with pain     Past Surgical History:  Procedure Laterality Date  . CATARACT EXTRACTION  2018  . laparascopy    . LAPAROSCOPIC OVARIAN    . VEIN LIGATION AND STRIPPING    . VITRECTOMY Bilateral 6/17-8/17    Current Meds  Medication Sig  . Butalbital-APAP-Caff-Cod (FIORICET/CODEINE) 50-300-40-30 MG CAPS Take by mouth.  . zaleplon (SONATA) 10 MG capsule Take 10 mg by mouth at bedtime as needed for sleep.    Allergies  Allergen Reactions  . Amitriptyline Other (See Comments)  . 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 Leonia ReevesGT for consideration of catheter ablation.  We have reviewed the potential procedure risks and its role as a lifestyle modifier.  The tachycardia and fear of its recurrence has had a big impact on her lifestyle.  She has lower extremity swelling.  She has a history of venous varicosities.  We will undertake a Doppler to make sure there is no deep venous thrombosis.  I will also reach out to seek expertise as relates to her pelvic varicosities.    More than 50% of 40 min was spent in counseling related to the above       Current medicines are reviewed at length with the patient today .  The patient does not  have concerns regarding medicines.

## 2017-12-23 NOTE — Patient Instructions (Signed)
Medication Instructions:  Your physician recommends that you continue on your current medications as directed. Please refer to the Current Medication list given to you today.  Labwork: None ordered.  Testing/Procedures: Your physician has requested that you have a lower or upper extremity venous duplex. This test is an ultrasound of the veins in the legs or arms. It looks at venous blood flow that carries blood from the heart to the legs or arms. Allow one hour for a Lower Venous exam. Allow thirty minutes for an Upper Venous exam. There are no restrictions or special instructions.   Follow-Up: Your physician wants you to follow-up in: 6 months with Dr Graciela HusbandsKlein. You will receive a reminder letter in the mail two months in advance. If you don't receive a letter, please call our office to schedule the follow-up appointment.  Follow up with Dr Ladona Ridgelaylor for an ablation.   Any Other Special Instructions Will Be Listed Below (If Applicable).     If you need a refill on your cardiac medications before your next appointment, please call your pharmacy.

## 2017-12-24 ENCOUNTER — Telehealth: Payer: Self-pay

## 2017-12-24 NOTE — Telephone Encounter (Signed)
LVM stating scheduling will be in contact regarding her LE doppler and setting up an appointment with Dr Ladona Ridgelaylor.

## 2018-01-09 ENCOUNTER — Ambulatory Visit (HOSPITAL_COMMUNITY)
Admission: RE | Admit: 2018-01-09 | Discharge: 2018-01-09 | Disposition: A | Discharge status: Home / Self Care | Payer: BC Managed Care – PPO | Source: Ambulatory Visit | Attending: Cardiovascular Disease | Admitting: Cardiovascular Disease

## 2018-01-09 DIAGNOSIS — R600 Localized edema: Secondary | ICD-10-CM | POA: Diagnosis not present

## 2018-01-17 ENCOUNTER — Institutional Professional Consult (permissible substitution): Payer: BC Managed Care – PPO | Admitting: Internal Medicine

## 2018-01-28 ENCOUNTER — Encounter: Payer: Self-pay | Admitting: Internal Medicine

## 2018-02-12 ENCOUNTER — Ambulatory Visit: Payer: BC Managed Care – PPO | Admitting: Internal Medicine

## 2018-02-12 ENCOUNTER — Encounter: Payer: Self-pay | Admitting: Internal Medicine

## 2018-02-12 VITALS — BP 110/72 | HR 58 | Ht 71.0 in | Wt 144.6 lb

## 2018-02-12 DIAGNOSIS — I471 Supraventricular tachycardia: Secondary | ICD-10-CM | POA: Diagnosis not present

## 2018-02-12 NOTE — Patient Instructions (Addendum)
Medication Instructions:  Your physician recommends that you continue on your current medications as directed. Please refer to the Current Medication list given to you today.  Labwork: None ordered.  Testing/Procedures: Your physician has recommended that you have an ablation. Catheter ablation is a medical procedure used to treat some cardiac arrhythmias (irregular heartbeats). During catheter ablation, a long, thin, flexible tube is put into a blood vessel in your groin (upper thigh), or neck. This tube is called an ablation catheter. It is then guided to your heart through the blood vessel. Radio frequency waves destroy small areas of heart tissue where abnormal heartbeats may cause an arrhythmia to start. Please see the instruction sheet given to you today.  Follow-Up:  October 10, 24, 25, 28 and 29 November 4, 7, 15, 18, 19, 25, 26  If you decide to go ahead with the procedure please call me: Ancil Boozer, RN with Dr. Ladona Ridgel [phone]   Any Other Special Instructions Will Be Listed Below (If Applicable).   If you need a refill on your cardiac medications before your next appointment, please call your pharmacy.     Cardiac Ablation Cardiac ablation is a procedure to disable (ablate) a small amount of heart tissue in very specific places. The heart has many electrical connections. Sometimes these connections are abnormal and can cause the heart to beat very fast or irregularly. Ablating some of the problem areas can improve the heart rhythm or return it to normal. Ablation may be done for people who:  Have Wolff-Parkinson-White syndrome.  Have fast heart rhythms (tachycardia).  Have taken medicines for an abnormal heart rhythm (arrhythmia) that were not effective or caused side effects.  Have a high-risk heartbeat that may be life-threatening.  During the procedure, a small incision is made in the neck or the groin, and a long, thin, flexible tube (catheter) is inserted into  the incision and moved to the heart. Small devices (electrodes) on the tip of the catheter will send out electrical currents. A type of X-ray (fluoroscopy) will be used to help guide the catheter and to provide images of the heart. Tell a health care provider about:  Any allergies you have.  All medicines you are taking, including vitamins, herbs, eye drops, creams, and over-the-counter medicines.  Any problems you or family members have had with anesthetic medicines.  Any blood disorders you have.  Any surgeries you have had.  Any medical conditions you have, such as kidney failure.  Whether you are pregnant or may be pregnant. What are the risks? Generally, this is a safe procedure. However, problems may occur, including:  Infection.  Bruising and bleeding at the catheter insertion site.  Bleeding into the chest, especially into the sac that surrounds the heart. This is a serious complication.  Stroke or blood clots.  Damage to other structures or organs.  Allergic reaction to medicines or dyes.  Need for a permanent pacemaker if the normal electrical system is damaged. A pacemaker is a small computer that sends electrical signals to the heart and helps your heart beat normally.  The procedure not being fully effective. This may not be recognized until months later. Repeat ablation procedures are sometimes required.  What happens before the procedure?  Follow instructions from your health care provider about eating or drinking restrictions.  Ask your health care provider about: ? Changing or stopping your regular medicines. This is especially important if you are taking diabetes medicines or blood thinners. ? Taking medicines such as aspirin  and ibuprofen. These medicines can thin your blood. Do not take these medicines before your procedure if your health care provider instructs you not to.  Plan to have someone take you home from the hospital or clinic.  If you will  be going home right after the procedure, plan to have someone with you for 24 hours. What happens during the procedure?  To lower your risk of infection: ? Your health care team will wash or sanitize their hands. ? Your skin will be washed with soap. ? Hair may be removed from the incision area.  An IV tube will be inserted into one of your veins.  You will be given a medicine to help you relax (sedative).  The skin on your neck or groin will be numbed.  An incision will be made in your neck or your groin.  A needle will be inserted through the incision and into a large vein in your neck or groin.  A catheter will be inserted into the needle and moved to your heart.  Dye may be injected through the catheter to help your surgeon see the area of the heart that needs treatment.  Electrical currents will be sent from the catheter to ablate heart tissue in desired areas. There are three types of energy that may be used to ablate heart tissue: ? Heat (radiofrequency energy). ? Laser energy. ? Extreme cold (cryoablation).  When the necessary tissue has been ablated, the catheter will be removed.  Pressure will be held on the catheter insertion area to prevent excessive bleeding.  A bandage (dressing) will be placed over the catheter insertion area. The procedure may vary among health care providers and hospitals. What happens after the procedure?  Your blood pressure, heart rate, breathing rate, and blood oxygen level will be monitored until the medicines you were given have worn off.  Your catheter insertion area will be monitored for bleeding. You will need to lie still for a few hours to ensure that you do not bleed from the catheter insertion area.  Do not drive for 24 hours or as long as directed by your health care provider. Summary  Cardiac ablation is a procedure to disable (ablate) a small amount of heart tissue in very specific places. Ablating some of the problem areas  can improve the heart rhythm or return it to normal.  During the procedure, electrical currents will be sent from the catheter to ablate heart tissue in desired areas. This information is not intended to replace advice given to you by your health care provider. Make sure you discuss any questions you have with your health care provider. Document Released: 09/09/2008 Document Revised: 03/12/2016 Document Reviewed: 03/12/2016 Elsevier Interactive Patient Education  [employer].

## 2018-02-12 NOTE — Progress Notes (Signed)
HPI Michelle Hodge is referred today for evaluation of SVT. She is a pleasant 57 yo woman who has a h/o palpitations. She had documented SVT while undergoing an exercise test. She has had several episodes where she went into SVT and almost passed out. Her episodes are notable for a starting and stopping suddenly. At times if feels like her heart is beating irregularly.  Allergies  Allergen Reactions  . Amitriptyline Other (See Comments)  . Erythromycin Nausea And Vomiting     Current Outpatient Medications  Medication Sig Dispense Refill  . Butalbital-APAP-Caff-Cod (FIORICET/CODEINE) 50-300-40-30 MG CAPS Take by mouth.    . zaleplon (SONATA) 10 MG capsule Take 10 mg by mouth at bedtime as needed for sleep.     No current facility-administered medications for this visit.      Past Medical History:  Diagnosis Date  . Anemia, unspecified   . Dermatitis herpetiformis   . Female climacteric state   . Hyperlipidemia   . Insomnia   . Interstitial cystitis   . Menopausal state   . Migraines   . Nephrolithiasis   . Osteopenia   . Pelvic congestion syndrome   . Sinusitis   . 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 Varicose veins of both lower extremities with pain     ROS:   All systems reviewed and negative except as noted in the HPI.   Past Surgical History:  Procedure Laterality Date  . CATARACT EXTRACTION  2018  . laparascopy    . LAPAROSCOPIC OVARIAN    . VEIN LIGATION AND STRIPPING    . VITRECTOMY Bilateral 6/17-8/17     Family History  Problem Relation Age of Onset  . Colon polyps Mother   . Atrial fibrillation Mother   . Hypertension Mother   . Lymphoma Father   . Colon polyps Father   . Cancer Brother        BLADDER  . Stroke Brother   . Retinal detachment Brother   . AAA (abdominal aortic aneurysm) Brother   . Obesity Brother   . Cancer Brother        CARCINOID  . Diabetes Brother   . Hypertension Brother       Social History   Socioeconomic History  . Marital status: Single    Spouse name: Not on file  . Number of children: Not on file  . Years of education: Not on file  . Highest education level: Not on file  Occupational History  . Not on file  Social Needs  . Financial resource strain: Not on file  . Food insecurity:    Worry: Not on file    Inability: Not on file  . Transportation needs:    Medical: Not on file    Non-medical: Not on file  Tobacco Use  . Smoking status: Never Smoker  . Smokeless tobacco: Never Used  Substance and Sexual Activity  . Alcohol use: Yes    Comment: a couple glasses of wine or beer a week  . Drug use: Never  . Sexual activity: Not on file  Lifestyle  . Physical activity:    Days per week: Not on file    Minutes per session: Not on file  . Stress: Not on file  Relationships  . Social connections:    Talks on phone: Not on file    Gets together: Not on file    Attends religious service: Not on file  Active member of club or organization: Not on file    Attends meetings of clubs or organizations: Not on file    Relationship status: Not on file  . Intimate partner violence:    Fear of current or ex partner: Not on file    Emotionally abused: Not on file    Physically abused: Not on file    Forced sexual activity: Not on file  Other Topics Concern  . Not on file  Social History Narrative  . Not on file     BP 110/72   Pulse (!) 58   Ht 5\' 11"  (1.803 m)   Wt 144 lb 9.6 oz (65.6 kg)   SpO2 99%   BMI 20.17 kg/m   Physical Exam:  Well appearing NAD HEENT: Unremarkable Neck:  No JVD, no thyromegally Lymphatics:  No adenopathy Back:  No CVA tenderness Lungs:  Clear with no wheezes HEART:  Regular rate rhythm, no murmurs, no rubs, no clicks Abd:  soft, positive bowel sounds, no organomegally, no rebound, no guarding Ext:  2 plus pulses, no edema, no cyanosis, no clubbing Skin:  No rashes no nodules Neuro:  CN II through  XII intact, motor grossly intact  EKG - nsr with no pre-excitation   Assess/Plan: 1. SVT - I have reviewed the findings of the stress test with the patient. She is unable to take a beta blocker due to her low bp. I have offered her EP study and ablation. She is considering her options. She is encouraged to avoid caffeine and ETOH.   Leonia Reeves.D.

## 2018-02-14 ENCOUNTER — Institutional Professional Consult (permissible substitution): Payer: BC Managed Care – PPO | Admitting: Internal Medicine

## 2018-02-18 ENCOUNTER — Other Ambulatory Visit: Payer: Self-pay

## 2018-02-18 DIAGNOSIS — I471 Supraventricular tachycardia: Secondary | ICD-10-CM

## 2018-02-18 NOTE — Progress Notes (Signed)
Orders entered for pre procedure labs.

## 2018-03-06 ENCOUNTER — Other Ambulatory Visit: Payer: BC Managed Care – PPO

## 2018-03-06 DIAGNOSIS — I471 Supraventricular tachycardia: Secondary | ICD-10-CM

## 2018-03-07 LAB — BASIC METABOLIC PANEL
BUN/Creatinine Ratio: 27 — ABNORMAL HIGH (ref 9–23)
BUN: 17 mg/dL (ref 6–24)
CO2: 25 mmol/L (ref 20–29)
Calcium: 9.6 mg/dL (ref 8.7–10.2)
Chloride: 100 mmol/L (ref 96–106)
Creatinine, Ser: 0.64 mg/dL (ref 0.57–1.00)
GFR calc Af Amer: 116 mL/min/{1.73_m2} (ref >59)
GFR calc non Af Amer: 101 mL/min/{1.73_m2} (ref >59)
Glucose: 84 mg/dL (ref 65–99)
Potassium: 3.9 mmol/L (ref 3.5–5.2)
Sodium: 140 mmol/L (ref 134–144)

## 2018-03-07 LAB — CBC WITH DIFFERENTIAL/PLATELET
Basophils Absolute: 0.1 10*3/uL (ref 0.0–0.2)
Basos: 2 %
EOS (ABSOLUTE): 0.2 10*3/uL (ref 0.0–0.4)
Eos: 3 %
Hematocrit: 35.7 % (ref 34.0–46.6)
Hemoglobin: 11.9 g/dL (ref 11.1–15.9)
Immature Grans (Abs): 0 10*3/uL (ref 0.0–0.1)
Immature Granulocytes: 0 %
Lymphocytes Absolute: 1.7 10*3/uL (ref 0.7–3.1)
Lymphs: 32 %
MCH: 30.5 pg (ref 26.6–33.0)
MCHC: 33.3 g/dL (ref 31.5–35.7)
MCV: 92 fL (ref 79–97)
Monocytes Absolute: 0.5 10*3/uL (ref 0.1–0.9)
Monocytes: 10 %
Neutrophils Absolute: 2.9 10*3/uL (ref 1.4–7.0)
Neutrophils: 53 %
Platelets: 224 10*3/uL (ref 150–450)
RBC: 3.9 x10E6/uL (ref 3.77–5.28)
RDW: 11.7 % — ABNORMAL LOW (ref 12.3–15.4)
WBC: 5.4 10*3/uL (ref 3.4–10.8)

## 2018-03-21 ENCOUNTER — Ambulatory Visit (HOSPITAL_COMMUNITY)
Admission: RE | Admit: 2018-03-21 | Discharge: 2018-03-21 | Disposition: A | Discharge status: Home / Self Care | Payer: BC Managed Care – PPO | Source: Ambulatory Visit | Attending: Internal Medicine | Admitting: Internal Medicine

## 2018-03-21 ENCOUNTER — Other Ambulatory Visit: Payer: Self-pay

## 2018-03-21 ENCOUNTER — Encounter (HOSPITAL_COMMUNITY)
Admission: RE | Disposition: A | Discharge status: Home / Self Care | Payer: Self-pay | Source: Ambulatory Visit | Attending: Internal Medicine

## 2018-03-21 DIAGNOSIS — Z9849 Cataract extraction status, unspecified eye: Secondary | ICD-10-CM | POA: Diagnosis not present

## 2018-03-21 DIAGNOSIS — Z888 Allergy status to other drugs, medicaments and biological substances status: Secondary | ICD-10-CM | POA: Diagnosis not present

## 2018-03-21 DIAGNOSIS — I8393 Asymptomatic varicose veins of bilateral lower extremities: Secondary | ICD-10-CM | POA: Insufficient documentation

## 2018-03-21 DIAGNOSIS — E785 Hyperlipidemia, unspecified: Secondary | ICD-10-CM | POA: Insufficient documentation

## 2018-03-21 DIAGNOSIS — N9489 Other specified conditions associated with female genital organs and menstrual cycle: Secondary | ICD-10-CM | POA: Insufficient documentation

## 2018-03-21 DIAGNOSIS — Z9889 Other specified postprocedural states: Secondary | ICD-10-CM | POA: Diagnosis not present

## 2018-03-21 DIAGNOSIS — M8580 Other specified disorders of bone density and structure, unspecified site: Secondary | ICD-10-CM | POA: Insufficient documentation

## 2018-03-21 DIAGNOSIS — I471 Supraventricular tachycardia, unspecified: Secondary | ICD-10-CM | POA: Diagnosis not present

## 2018-03-21 DIAGNOSIS — Z8249 Family history of ischemic heart disease and other diseases of the circulatory system: Secondary | ICD-10-CM | POA: Diagnosis not present

## 2018-03-21 DIAGNOSIS — G4700 Insomnia, unspecified: Secondary | ICD-10-CM | POA: Diagnosis not present

## 2018-03-21 DIAGNOSIS — Z881 Allergy status to other antibiotic agents status: Secondary | ICD-10-CM | POA: Insufficient documentation

## 2018-03-21 DIAGNOSIS — Z87442 Personal history of urinary calculi: Secondary | ICD-10-CM | POA: Insufficient documentation

## 2018-03-21 HISTORY — PX: SVT ABLATION: EP1225

## 2018-03-21 SURGERY — SVT ABLATION

## 2018-03-21 MED ORDER — BUPIVACAINE HCL (PF) 0.25 % IJ SOLN
INTRAMUSCULAR | Status: AC
  Filled 2018-03-21: qty 60

## 2018-03-21 MED ORDER — HEPARIN (PORCINE) IN NACL 1000-0.9 UT/500ML-% IV SOLN
INTRAVENOUS | Status: DC | PRN
  Administered 2018-03-21: 500 mL

## 2018-03-21 MED ORDER — FENTANYL CITRATE (PF) 100 MCG/2ML IJ SOLN
INTRAMUSCULAR | Status: DC | PRN
  Administered 2018-03-21 (×2): 12.5 ug via INTRAVENOUS
  Administered 2018-03-21: 25 ug via INTRAVENOUS

## 2018-03-21 MED ORDER — FENTANYL CITRATE (PF) 100 MCG/2ML IJ SOLN
INTRAMUSCULAR | Status: AC
  Filled 2018-03-21: qty 2

## 2018-03-21 MED ORDER — MIDAZOLAM HCL 5 MG/5ML IJ SOLN
INTRAMUSCULAR | Status: AC
  Filled 2018-03-21: qty 5

## 2018-03-21 MED ORDER — ISOPROTERENOL HCL 0.2 MG/ML IJ SOLN
INTRAMUSCULAR | Status: AC
  Filled 2018-03-21: qty 5

## 2018-03-21 MED ORDER — SODIUM CHLORIDE 0.9 % IV SOLN
INTRAVENOUS | Status: DC | PRN
  Administered 2018-03-21: 4 ug/min via INTRAVENOUS

## 2018-03-21 MED ORDER — FENTANYL CITRATE (PF) 100 MCG/2ML IJ SOLN
25.0000 ug | Freq: Once | INTRAMUSCULAR | Status: AC
  Administered 2018-03-21: 25 ug via INTRAVENOUS

## 2018-03-21 MED ORDER — HEPARIN (PORCINE) IN NACL 1000-0.9 UT/500ML-% IV SOLN
INTRAVENOUS | Status: AC
  Filled 2018-03-21: qty 500

## 2018-03-21 MED ORDER — MIDAZOLAM HCL 5 MG/5ML IJ SOLN
INTRAMUSCULAR | Status: DC | PRN
  Administered 2018-03-21: 2 mg via INTRAVENOUS
  Administered 2018-03-21 (×7): 1 mg via INTRAVENOUS

## 2018-03-21 MED ORDER — BUPIVACAINE HCL (PF) 0.25 % IJ SOLN
INTRAMUSCULAR | Status: DC | PRN
  Administered 2018-03-21: 45 mL

## 2018-03-21 MED ORDER — SODIUM CHLORIDE 0.9 % IV SOLN
INTRAVENOUS | Status: DC
  Administered 2018-03-21: 08:00:00 via INTRAVENOUS

## 2018-03-21 SURGICAL SUPPLY — 10 items
BAG SNAP BAND KOVER 36X36 (MISCELLANEOUS) ×2 IMPLANT
CATH HEX JOS 2-5-2 65CM 6F REP (CATHETERS) ×2 IMPLANT
CATH JOSEPH QUAD ALLRED 6F REP (CATHETERS) ×4 IMPLANT
PACK EP LATEX FREE (CUSTOM PROCEDURE TRAY) ×1
PACK EP LF (CUSTOM PROCEDURE TRAY) ×1 IMPLANT
PAD PRO RADIOLUCENT 2001M-C (PAD) ×2 IMPLANT
SHEATH PINNACLE 6F 10CM (SHEATH) ×4 IMPLANT
SHEATH PINNACLE 7F 10CM (SHEATH) ×2 IMPLANT
SHEATH PINNACLE 8F 10CM (SHEATH) ×2 IMPLANT
SHIELD RADPAD SCOOP 12X17 (MISCELLANEOUS) ×2 IMPLANT

## 2018-03-21 NOTE — H&P (Signed)
HPI [name] is referred today for evaluation of SVT. She is a pleasant 57 yo woman who has a h/o palpitations. She had documented SVT while undergoing an exercise test. She has had several episodes where she went into SVT and almost passed out. Her episodes are notable for a starting and stopping suddenly. At times if feels like her heart is beating irregularly.  Allergies  Allergen Reactions  . Amitriptyline Other (See Comments)  . Erythromycin Nausea And Vomiting           Current Outpatient Medications  Medication Sig Dispense Refill  . Butalbital-APAP-Caff-Cod (FIORICET/CODEINE) 50-300-40-30 MG CAPS Take by mouth.    . zaleplon (SONATA) 10 MG capsule Take 10 mg by mouth at bedtime as needed for sleep.     No current facility-administered medications for this visit.          Past Medical History:  Diagnosis Date  . Anemia, unspecified   . Dermatitis herpetiformis   . Female climacteric state   . Hyperlipidemia   . Insomnia   . Interstitial cystitis   . Menopausal state   . Migraines   . Nephrolithiasis   . Osteopenia   . Pelvic congestion syndrome   . Sinusitis   . 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 Varicose veins of both lower extremities with pain     ROS:   All systems reviewed and negative except as noted in the HPI.        Past Surgical History:  Procedure Laterality Date  . CATARACT EXTRACTION  2018  . laparascopy    . LAPAROSCOPIC OVARIAN    . VEIN LIGATION AND STRIPPING    . VITRECTOMY Bilateral 6/17-8/17          Family History  Problem Relation Age of Onset  . Colon polyps Mother   . Atrial fibrillation Mother   . Hypertension Mother   . Lymphoma Father   . Colon polyps Father   . Cancer Brother        BLADDER  . Stroke Brother   . Retinal detachment Brother   . AAA (abdominal aortic aneurysm) Brother   . Obesity Brother   . Cancer Brother         CARCINOID  . Diabetes Brother   . Hypertension Brother      Social History        Socioeconomic History  . Marital status: Single    Spouse name: Not on file  . Number of children: Not on file  . Years of education: Not on file  . Highest education level: Not on file  Occupational History  . Not on file  Social Needs  . Financial resource strain: Not on file  . Food insecurity:    Worry: Not on file    Inability: Not on file  . Transportation needs:    Medical: Not on file    Non-medical: Not on file  Tobacco Use  . Smoking status: Never Smoker  . Smokeless tobacco: Never Used  Substance and Sexual Activity  . Alcohol use: Yes    Comment: a couple glasses of wine or beer a week  . Drug use: Never  . Sexual activity: Not on file  Lifestyle  . Physical activity:    Days per week: Not on file    Minutes per session: Not on file  . Stress: Not on file  Relationships  . Social connections:    Talks on phone: Not  on file    Gets together: Not on file    Attends religious service: Not on file    Active member of club or organization: Not on file    Attends meetings of clubs or organizations: Not on file    Relationship status: Not on file  . Intimate partner violence:    Fear of current or ex partner: Not on file    Emotionally abused: Not on file    Physically abused: Not on file    Forced sexual activity: Not on file  Other Topics Concern  . Not on file  Social History Narrative  . Not on file     BP 110/72   Pulse (!) 58   Ht 5\' 11"  (1.803 m)   Wt 144 lb 9.6 oz (65.6 kg)   SpO2 99%   BMI 20.17 kg/m   Physical Exam:  Well appearing NAD HEENT: Unremarkable Neck:  No JVD, no thyromegally Lymphatics:  No adenopathy Back:  No CVA tenderness Lungs:  Clear with no wheezes HEART:  Regular rate rhythm, no murmurs, no rubs, no clicks Abd:  soft, positive bowel sounds, no organomegally, no rebound, no  guarding Ext:  2 plus pulses, no edema, no cyanosis, no clubbing Skin:  No rashes no nodules Neuro:  CN II through XII intact, motor grossly intact  EKG - nsr with no pre-excitation   Assess/Plan: 1. SVT - I have reviewed the findings of the stress test with the patient. She is unable to take a beta blocker due to her low bp. I have offered her EP study and ablation. She is considering her options. She is encouraged to avoid caffeine and ETOH.   Gerhard MunchGregg Taylor,M.D.  EP Attending  Patient seen and examined. Agree with above. Since last clinic visit she has had palpitations but no SVT requiring adenosine. I have discussed the indications/risks/benefits/goals/expectations of EP study and catheter ablation and she wishes to proceed.  Dorathy DaftGregg Taylor,MD.

## 2018-03-21 NOTE — Progress Notes (Addendum)
Patient is seen post procedure Dr. Ladona Ridgelaylor spoke to the husband post procedure, and is in the OR currently.  He was OK with the patient leaving after bedrest completes if the patient was comfortable after discussing and being seen by me. I informed the patient that Dr.Cleone Hulick felt he would be finished only shortly after her bedrest is completed that he intends to come and speak with her, though if she felt she couldn't wait, would be OK to leave if she wanted to. Discuss with her he was unable to induce her tachycardia and no ablation was performed. She was aware, her husband had informed her. I encouraged her to stay as long as she felt she could to talk with Dr. Ladona Ridgelaylor given he was not expected to me much longer Her bed rest is almost completed She feels well, no CP, SOB or site pain SR on telemetry, VSS Sites (R IJ and R groin) are examined, no bleeding, hematomas, or tenderness. Discussed site care and activity instructions with the patient Post procedure f/u is in place.  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  I recommended that she wait at least until Wed, and only if she could work within the activity restrictions, nothing more then casual walking, no lifting more then 5lbs for 7 days  Francis Dowseenee Ursuy, PA-C  EP Attending  Patient seen and examined. Agree with above. She is stable for DC home.Her right groin is without bleeding or hematoma.  Leonia ReevesGregg Laiyah Exline,M.D.

## 2018-03-21 NOTE — Discharge Instructions (Signed)
Femoral Site Care °Refer to this sheet in the next few weeks. These instructions provide you with information about caring for yourself after your procedure. Your health care provider may also give you more specific instructions. Your treatment has been planned according to current medical practices, but problems sometimes occur. Call your health care provider if you have any problems or questions after your procedure. °What can I expect after the procedure? °After your procedure, it is typical to have the following: °· Bruising at the site that usually fades within 1-2 weeks. °· Blood collecting in the tissue (hematoma) that may be painful to the touch. It should usually decrease in size and tenderness within 1-2 weeks. ° °Follow these instructions at home: °· Take medicines only as directed by your health care provider. °· You may shower 24-48 hours after the procedure or as directed by your health care provider. Remove the bandage (dressing) and gently wash the site with plain soap and water. Pat the area dry with a clean towel. Do not rub the site, because this may cause bleeding. °· Do not take baths, swim, or use a hot tub until your health care provider approves. °· Check your insertion site every day for redness, swelling, or drainage. °· Do not apply powder or lotion to the site. °· Limit use of stairs to twice a day for the first 2-3 days or as directed by your health care provider. °· Do not squat for the first 2-3 days or as directed by your health care provider. °· Do not lift over 10 lb (4.5 kg) for 5 days after your procedure or as directed by your health care provider. °· Ask your health care provider when it is okay to: °? Return to work or school. °? Resume usual physical activities or sports. °? Resume sexual activity. °· Do not drive home if you are discharged the same day as the procedure. Have someone else drive you. °· You may drive 24 hours after the procedure unless otherwise instructed by  your health care provider. °· Do not operate machinery or power tools for 24 hours after the procedure or as directed by your health care provider. °· If your procedure was done as an outpatient procedure, which means that you went home the same day as your procedure, a responsible adult should be with you for the first 24 hours after you arrive home. °· Keep all follow-up visits as directed by your health care provider. This is important. °Contact a health care provider if: °· You have a fever. °· You have chills. °· You have increased bleeding from the site. Hold pressure on the site. °Get help right away if: °· You have unusual pain at the site. °· You have redness, warmth, or swelling at the site. °· You have drainage (other than a small amount of blood on the dressing) from the site. °· The site is bleeding, and the bleeding does not stop after 30 minutes of holding steady pressure on the site. °· Your leg or foot becomes pale, cool, tingly, or numb. °This information is not intended to replace advice given to you by your health care provider. Make sure you discuss any questions you have with your health care provider. °Document Released: 12/25/2013 Document Revised: 09/29/2015 Document Reviewed: 11/10/2013 °Elsevier Interactive Patient Education © 2018 [employer] Inc[employer] °Moderate Conscious Sedation, Adult, Care After °These instructions provide you with information about caring for yourself after your procedure. Your health care provider may also give you more   specific instructions. Your treatment has been planned according to current medical practices, but problems sometimes occur. Call your health care provider if you have any problems or questions after your procedure. What can I expect after the procedure? After your procedure, it is common:  To feel sleepy for several hours.  To feel clumsy and have poor balance for several hours.  To have poor judgment for several hours.  To vomit if you eat too  soon.  Follow these instructions at home: For at least 24 hours after the procedure:   Do not: ? Participate in activities where you could fall or become injured. ? Drive. ? Use heavy machinery. ? Drink alcohol. ? Take sleeping pills or medicines that cause drowsiness. ? Make important decisions or sign legal documents. ? Take care of children on your own.  Rest. Eating and drinking  Follow the diet recommended by your health care provider.  If you vomit: ? Drink water, juice, or soup when you can drink without vomiting. ? Make sure you have little or no nausea before eating solid foods. General instructions  Have a responsible adult stay with you until you are awake and alert.  Take over-the-counter and prescription medicines only as told by your health care provider.  If you smoke, do not smoke without supervision.  Keep all follow-up visits as told by your health care provider. This is important. Contact a health care provider if:  You keep feeling nauseous or you keep vomiting.  You feel light-headed.  You develop a rash.  You have a fever. Get help right away if:  You have trouble breathing. This information is not intended to replace advice given to you by your health care provider. Make sure you discuss any questions you have with your health care provider. Document Released: 02/11/2013 Document Revised: 09/26/2015 Document Reviewed: 08/13/2015 Elsevier Interactive Patient Education  2018 [employer]     Post procedure care instructions No driving for 4 days. No lifting over 5 lbs for 1 week. No vigorous or sexual activity for 1 week. You may return to work on 03/28/18. Keep procedure site clean & dry. If you notice increased pain, swelling, bleeding or pus, call/return!  You may shower, but no soaking baths/hot tubs/pools for 1 week.

## 2018-03-21 NOTE — Progress Notes (Signed)
147fr sheath removed from right IJ. Manual pressure applied for 10 minutes. Site level 0. Tegaderm dressing applied.

## 2018-03-24 ENCOUNTER — Encounter (HOSPITAL_COMMUNITY): Payer: Self-pay | Admitting: Internal Medicine

## 2018-04-23 ENCOUNTER — Ambulatory Visit (INDEPENDENT_AMBULATORY_CARE_PROVIDER_SITE_OTHER): Payer: BC Managed Care – PPO | Admitting: Internal Medicine

## 2018-04-23 ENCOUNTER — Encounter: Payer: Self-pay | Admitting: Internal Medicine

## 2018-04-23 VITALS — BP 126/60 | HR 53 | Ht 71.0 in | Wt 149.0 lb

## 2018-04-23 DIAGNOSIS — I471 Supraventricular tachycardia: Secondary | ICD-10-CM

## 2018-04-23 DIAGNOSIS — K219 Gastro-esophageal reflux disease without esophagitis: Secondary | ICD-10-CM

## 2018-04-23 DIAGNOSIS — G43909 Migraine, unspecified, not intractable, without status migrainosus: Secondary | ICD-10-CM | POA: Insufficient documentation

## 2018-04-23 DIAGNOSIS — I8390 Asymptomatic varicose veins of unspecified lower extremity: Secondary | ICD-10-CM | POA: Insufficient documentation

## 2018-04-23 DIAGNOSIS — F329 Major depressive disorder, single episode, unspecified: Secondary | ICD-10-CM | POA: Insufficient documentation

## 2018-04-23 DIAGNOSIS — Z8659 Personal history of other mental and behavioral disorders: Secondary | ICD-10-CM | POA: Insufficient documentation

## 2018-04-23 DIAGNOSIS — I341 Nonrheumatic mitral (valve) prolapse: Secondary | ICD-10-CM | POA: Insufficient documentation

## 2018-04-23 DIAGNOSIS — N951 Menopausal and female climacteric states: Secondary | ICD-10-CM | POA: Insufficient documentation

## 2018-04-23 DIAGNOSIS — IMO0001 Reserved for inherently not codable concepts without codable children: Secondary | ICD-10-CM | POA: Insufficient documentation

## 2018-04-23 DIAGNOSIS — F32A Depression, unspecified: Secondary | ICD-10-CM | POA: Insufficient documentation

## 2018-04-23 NOTE — Progress Notes (Signed)
HPI [name] returns today for ongoing followup of palpitations and SVT. She underwent EP study several weeks ago and at that time had no inducible sustained SVT. She had variable activation sequences in SVT. She has been stable in the interim with no real symptoms. She has obtained an Apple watch. She has a multitude of questions regarding her HR.  Allergies  Allergen Reactions  . Erythromycin Nausea And Vomiting     Current Outpatient Medications  Medication Sig Dispense Refill  . Cholecalciferol (VITAMIN D3) 50 MCG (2000 UT) capsule Take 2,000 Units by mouth daily.    . vitamin C (ASCORBIC ACID) 500 MG tablet Take 500 mg by mouth daily.    . zaleplon (SONATA) 10 MG capsule Take 10 mg by mouth at bedtime as needed for sleep.     No current facility-administered medications for this visit.      Past Medical History:  Diagnosis Date  . Anemia, unspecified   . Dermatitis herpetiformis   . Female climacteric state   . Hyperlipidemia   . Insomnia   . Interstitial cystitis   . Menopausal state   . Migraines   . Nephrolithiasis   . Osteopenia   . Pelvic congestion syndrome   . Sinusitis   . 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 Varicose veins of both lower extremities with pain     ROS:   All systems reviewed and negative except as noted in the HPI.   Past Surgical History:  Procedure Laterality Date  . CATARACT EXTRACTION  2018  . laparascopy    . LAPAROSCOPIC OVARIAN    . SVT ABLATION N/A 03/21/2018   Procedure: SVT ABLATION;  Surgeon: Marinus Mawaylor, Gregg W, MD;  [facility];  Service: Cardiovascular;  Laterality: N/A;  . VEIN LIGATION AND STRIPPING    . VITRECTOMY Bilateral 6/17-8/17     Family History  Problem Relation Age of Onset  . Colon polyps Mother   . Atrial fibrillation Mother   . Hypertension Mother   . Lymphoma Father   . Colon polyps Father   . Cancer Brother        BLADDER  . Stroke  Brother   . Retinal detachment Brother   . AAA (abdominal aortic aneurysm) Brother   . Obesity Brother   . Cancer Brother        CARCINOID  . Diabetes Brother   . Hypertension Brother      Social History   Socioeconomic History  . Marital status: Significant Other    Spouse name: Not on file  . Number of children: Not on file  . Years of education: Not on file  . Highest education level: Not on file  Occupational History  . Not on file  Social Needs  . Financial resource strain: Not on file  . Food insecurity:    Worry: Not on file    Inability: Not on file  . Transportation needs:    Medical: Not on file    Non-medical: Not on file  Tobacco Use  . Smoking status: Never Smoker  . Smokeless tobacco: Never Used  Substance and Sexual Activity  . Alcohol use: Yes    Comment: a couple glasses of wine or beer a week  . Drug use: Never  . Sexual activity: Not on file  Lifestyle  . Physical activity:    Days per week: Not on file    Minutes per session:  Not on file  . Stress: Not on file  Relationships  . Social connections:    Talks on phone: Not on file    Gets together: Not on file    Attends religious service: Not on file    Active member of club or organization: Not on file    Attends meetings of clubs or organizations: Not on file    Relationship status: Not on file  . Intimate partner violence:    Fear of current or ex partner: Not on file    Emotionally abused: Not on file    Physically abused: Not on file    Forced sexual activity: Not on file  Other Topics Concern  . Not on file  Social History Narrative  . Not on file     BP 126/60   Pulse (!) 53   Ht 5\' 11"  (1.803 m)   Wt 149 lb (67.6 kg)   BMI 20.78 kg/m   Physical Exam:  Well appearing 57 yo man, NAD HEENT: Unremarkable Neck:  6 cm JVD, no thyromegally Lymphatics:  No adenopathy Back:  No CVA tenderness Lungs:  Clear with no wheezes HEART:  Regular rate rhythm, no murmurs, no rubs, no  clicks Abd:  soft, positive bowel sounds, no organomegally, no rebound, no guarding Ext:  2 plus pulses, no edema, no cyanosis, no clubbing Skin:  No rashes no nodules Neuro:  CN II through XII intact, motor grossly intact  EKG - nsr with LVH  Assess/Plan: 1. SVT - she had no sustained SVT on EP study. We discussed the implications and she will undergo watchful waiting.  I spent 15 minutes with the patient including 50% face to face time.  Leonia Reeves.D.

## 2018-04-23 NOTE — Patient Instructions (Addendum)

## 2018-04-28 ENCOUNTER — Other Ambulatory Visit: Payer: Self-pay | Admitting: Family Medicine

## 2018-04-28 DIAGNOSIS — Z1231 Encounter for screening mammogram for malignant neoplasm of breast: Secondary | ICD-10-CM

## 2018-05-02 ENCOUNTER — Ambulatory Visit
Admission: RE | Admit: 2018-05-02 | Discharge: 2018-05-02 | Disposition: A | Discharge status: Home / Self Care | Payer: BC Managed Care – PPO | Source: Ambulatory Visit | Attending: Family Medicine | Admitting: Family Medicine

## 2018-05-02 DIAGNOSIS — Z1231 Encounter for screening mammogram for malignant neoplasm of breast: Secondary | ICD-10-CM

## 2018-07-07 ENCOUNTER — Other Ambulatory Visit: Payer: Self-pay | Admitting: Family Medicine

## 2018-07-07 ENCOUNTER — Ambulatory Visit
Admission: RE | Admit: 2018-07-07 | Discharge: 2018-07-07 | Disposition: A | Discharge status: Home / Self Care | Payer: BC Managed Care – PPO | Source: Ambulatory Visit | Attending: Family Medicine | Admitting: Family Medicine

## 2018-07-07 DIAGNOSIS — R52 Pain, unspecified: Secondary | ICD-10-CM

## 2018-10-15 ENCOUNTER — Other Ambulatory Visit: Payer: Self-pay

## 2018-10-15 DIAGNOSIS — I83893 Varicose veins of bilateral lower extremities with other complications: Secondary | ICD-10-CM

## 2018-10-16 ENCOUNTER — Other Ambulatory Visit: Payer: Self-pay

## 2018-10-16 ENCOUNTER — Ambulatory Visit: Payer: BC Managed Care – PPO | Admitting: Vascular Surgery

## 2018-10-16 ENCOUNTER — Encounter: Payer: Self-pay | Admitting: Vascular Surgery

## 2018-10-16 ENCOUNTER — Ambulatory Visit (HOSPITAL_COMMUNITY)
Admission: RE | Admit: 2018-10-16 | Discharge: 2018-10-16 | Disposition: A | Discharge status: Home / Self Care | Payer: BC Managed Care – PPO | Source: Ambulatory Visit | Attending: Vascular Surgery | Admitting: Vascular Surgery

## 2018-10-16 VITALS — BP 108/68 | HR 56 | Temp 98.8°F | Resp 14 | Ht 71.0 in | Wt 146.0 lb

## 2018-10-16 DIAGNOSIS — I872 Venous insufficiency (chronic) (peripheral): Secondary | ICD-10-CM

## 2018-10-16 DIAGNOSIS — I83893 Varicose veins of bilateral lower extremities with other complications: Secondary | ICD-10-CM | POA: Diagnosis present

## 2018-10-16 DIAGNOSIS — I83813 Varicose veins of bilateral lower extremities with pain: Secondary | ICD-10-CM | POA: Diagnosis not present

## 2018-10-16 NOTE — Progress Notes (Signed)
REASON FOR CONSULT:    Painful varicose veins bilaterally.  The consult is requested by Dr. Shirlean Mylararol Webb  ASSESSMENT & PLAN:   PAINFUL VARICOSE VEINS BILATERALLY: This patient has significant venous insufficiency bilaterally.  Previous CT scan in 2015 showed some evidence of pelvic venous congestion on the left.  Her most symptomatic side is the right leg where she has deep venous reflux in the common femoral vein and also reflux at the saphenofemoral junction which feeds and accessory saphenous vein and some significantly dilated varicosities along her medial right thigh and right calf.  On the left side she has reflux in the common femoral vein and femoral vein in the deep system.  She also has superficial venous reflux involving the great saphenous vein which is dilated up to 0.57 cm in the thigh.  We have discussed the importance of intermittent leg elevation and the proper positioning for this.  In addition she has thigh-high compression stockings with a gradient of 20 to 30 mmHg which she recently obtained with a prescription she got from Dr. Hyman HopesWebb.  I have encouraged her to wear these during the day when she is on her feet.  I have encouraged her to avoid prolonged sitting and standing.  I encouraged her to exercise.  Specifically walking and water aerobics are especially helpful for venous disease.  We also discussed the importance of weight management.  I think we could potentially help relieve her symptoms on the right with a high ligation of the right great saphenous vein and greater than 20 stab phlebectomies to address the accessory saphenous vein which is very tortuous in the multiple varicose veins in the thigh and calf.  She will consider this option.  If her symptoms progress on the left I think she could potentially be a candidate for laser ablation of the left great saphenous vein with stab phlebectomies.  [name], MD, FACS Beeper [phone] Office: [phone]   HPI:   Michelle Hodge is a pleasant 58 y.o. female, with a long history of varicose veins of both lower extremities.  She has had these for over 25 years.  During a pregnancy 26 years ago she developed large dilated varicose veins in the right leg and phlebitis on the medial proximal calf.  She ultimately underwent ligation and stripping of the entire right great saphenous vein at that time.  She denies any history of DVT or phlebitis.  She gradually developed recurrence of her varicose veins in the right leg and is also developed varicose veins in the left side.  She describes pain in both legs which she describes as aching and heaviness.  The symptoms are aggravated by sitting and standing and relieved with elevation.  She works as a Runner, broadcasting/film/videoteacher and her symptoms are worse during the day when she standing.  She does not like to take ibuprofen.  She has just recently begun wearing her thigh-high compression stockings.  Past medical history is otherwise significant for SVT.  She is undergone a recent ablation procedure.  Past Medical History:  Diagnosis Date  . Anemia, unspecified   . Dermatitis herpetiformis   . Female climacteric state   . Hyperlipidemia   . Insomnia   . Interstitial cystitis   . Menopausal state   . Migraines   . Nephrolithiasis   . Osteopenia   . Pelvic congestion syndrome   . Sinusitis   . 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 Varicose veins of both  lower extremities with pain     Family History  Problem Relation Age of Onset  . Colon polyps Mother   . Atrial fibrillation Mother   . Hypertension Mother   . Lymphoma Father   . Colon polyps Father   . Cancer Brother        BLADDER  . Stroke Brother   . Retinal detachment Brother   . AAA (abdominal aortic aneurysm) Brother   . Obesity Brother   . Cancer Brother        CARCINOID  . Diabetes Brother   . Hypertension Brother     SOCIAL HISTORY: Social History   Socioeconomic  History  . Marital status: Significant Other    Spouse name: Not on file  . Number of children: Not on file  . Years of education: Not on file  . Highest education level: Not on file  Occupational History  . Not on file  Social Needs  . Financial resource strain: Not on file  . Food insecurity    Worry: Not on file    Inability: Not on file  . Transportation needs    Medical: Not on file    Non-medical: Not on file  Tobacco Use  . Smoking status: Never Smoker  . Smokeless tobacco: Never Used  Substance and Sexual Activity  . Alcohol use: Yes    Comment: a couple glasses of wine or beer a week  . Drug use: Never  . Sexual activity: Not on file  Lifestyle  . Physical activity    Days per week: Not on file    Minutes per session: Not on file  . Stress: Not on file  Relationships  . Social Herbalist on phone: Not on file    Gets together: Not on file    Attends religious service: Not on file    Active member of club or organization: Not on file    Attends meetings of clubs or organizations: Not on file    Relationship status: Not on file  . Intimate partner violence    Fear of current or ex partner: Not on file    Emotionally abused: Not on file    Physically abused: Not on file    Forced sexual activity: Not on file  Other Topics Concern  . Not on file  Social History Narrative  . Not on file    Allergies  Allergen Reactions  . Erythromycin Nausea And Vomiting    Current Outpatient Medications  Medication Sig Dispense Refill  . Cholecalciferol (VITAMIN D3) 50 MCG (2000 UT) capsule Take 2,000 Units by mouth daily.    . Ferrous Sulfate (IRON PO) Take 65 mg by mouth daily.    . vitamin C (ASCORBIC ACID) 500 MG tablet Take 500 mg by mouth daily.    . zaleplon (SONATA) 10 MG capsule Take 10 mg by mouth at bedtime as needed for sleep.     No current facility-administered medications for this visit.     REVIEW OF SYSTEMS:  [X]  denotes positive finding,  [ ]  denotes negative finding Cardiac  Comments:  Chest pain or chest pressure:    Shortness of breath upon exertion: x   Short of breath when lying flat:    Irregular heart rhythm: x  SVT      Vascular    Pain in calf, thigh, or hip brought on by ambulation:    Pain in feet at night that wakes you up from your sleep:  Blood clot in your veins:    Leg swelling:  x       Pulmonary    Oxygen at home:    Productive cough:     Wheezing:         Neurologic    Sudden weakness in arms or legs:     Sudden numbness in arms or legs:     Sudden onset of difficulty speaking or slurred speech:    Temporary loss of vision in one eye:     Problems with dizziness:         Gastrointestinal    Blood in stool:     Vomited blood:         Genitourinary    Burning when urinating:     Blood in urine:        Psychiatric    Major depression:         Hematologic    Bleeding problems:    Problems with blood clotting too easily:        Skin    Rashes or ulcers:        Constitutional    Fever or chills:     PHYSICAL EXAM:   Vitals:   10/16/18 1412  BP: 108/68  Pulse: (!) 56  Resp: 14  Temp: 98.8 F (37.1 C)  TempSrc: Temporal  SpO2: 99%  Weight: 146 lb (66.2 kg)  Height: 5\' 11"  (1.803 m)    GENERAL: The patient is a well-nourished female, in no acute distress. The vital signs are documented above. CARDIAC: There is a regular rate and rhythm.  VASCULAR: I do not detect carotid bruits. She has palpable pedal pulses bilaterally. VENOUS EXAM: She has large dilated varicose veins of both lower extremities as documented below.  She does have hyperpigmentation of the medial right ankle as documented below.  She has spider veins bilaterally. Currently she does not have significant lower extremity swelling. I did look at the saphenofemoral junction and a sensory saphenous vein on the right with the SonoSite.  She has incompetence of the saphenofemoral junction.  The sensory saphenous  vein is markedly tortuous and therefore she would not be a candidate for laser ablation of this. On the left side she has reflux in the left great saphenous vein in the vein is dilated.  RIGHT LEG:       LEFT LEG:      PULMONARY: There is good air exchange bilaterally without wheezing or rales. ABDOMEN: Soft and non-tender with normal pitched bowel sounds.  MUSCULOSKELETAL: There are no major deformities or cyanosis. NEUROLOGIC: No focal weakness or paresthesias are detected. SKIN: There are no ulcers or rashes noted. PSYCHIATRIC: The patient has a normal affect.  DATA:    VENOUS DUPLEX: I have independently interpreted her venous duplex scan today.  On the left side there is no evidence of DVT.  There is deep venous reflux involving the common femoral vein and femoral vein.  There is superficial venous reflux involving the left great saphenous vein which is significantly dilated.  In the thigh this measures up to 0.57 cm.  On the right side there is no evidence of DVT.  There is deep venous reflux involving the common femoral vein the only superficial venous reflux is in a small anterior sensory saphenous vein.  A total of 50 minutes was spent on this visit. 25 minutes was face to face time. More than 50% of the time was spent on counseling and coordinating with the patient.

## 2019-02-20 ENCOUNTER — Other Ambulatory Visit: Payer: Self-pay

## 2019-02-20 DIAGNOSIS — Z20822 Contact with and (suspected) exposure to covid-19: Secondary | ICD-10-CM

## 2019-02-22 LAB — NOVEL CORONAVIRUS, NAA: SARS-CoV-2, NAA: NOT DETECTED

## 2019-04-09 ENCOUNTER — Other Ambulatory Visit: Payer: Self-pay | Admitting: Family Medicine

## 2019-04-09 DIAGNOSIS — Z1231 Encounter for screening mammogram for malignant neoplasm of breast: Secondary | ICD-10-CM

## 2019-05-28 ENCOUNTER — Ambulatory Visit
Admission: RE | Admit: 2019-05-28 | Discharge: 2019-05-28 | Disposition: A | Discharge status: Home / Self Care | Payer: BC Managed Care – PPO | Source: Ambulatory Visit | Attending: Family Medicine | Admitting: Family Medicine

## 2019-05-28 ENCOUNTER — Other Ambulatory Visit: Payer: Self-pay

## 2019-05-28 DIAGNOSIS — Z1231 Encounter for screening mammogram for malignant neoplasm of breast: Secondary | ICD-10-CM

## 2020-08-25 ENCOUNTER — Other Ambulatory Visit: Payer: Self-pay | Admitting: Family Medicine

## 2020-08-25 DIAGNOSIS — Z1231 Encounter for screening mammogram for malignant neoplasm of breast: Secondary | ICD-10-CM

## 2020-08-26 ENCOUNTER — Ambulatory Visit: Payer: BC Managed Care – PPO

## 2020-11-22 ENCOUNTER — Other Ambulatory Visit: Payer: Self-pay

## 2020-11-22 ENCOUNTER — Ambulatory Visit
Admission: RE | Admit: 2020-11-22 | Discharge: 2020-11-22 | Disposition: A | Discharge status: Home / Self Care | Payer: BC Managed Care – PPO | Source: Ambulatory Visit | Attending: Family Medicine | Admitting: Family Medicine

## 2020-11-22 DIAGNOSIS — Z1231 Encounter for screening mammogram for malignant neoplasm of breast: Secondary | ICD-10-CM

## 2021-04-24 ENCOUNTER — Ambulatory Visit (INDEPENDENT_AMBULATORY_CARE_PROVIDER_SITE_OTHER): Payer: BC Managed Care – PPO

## 2021-04-24 ENCOUNTER — Encounter: Payer: Self-pay | Admitting: Internal Medicine

## 2021-04-24 ENCOUNTER — Other Ambulatory Visit: Payer: Self-pay

## 2021-04-24 ENCOUNTER — Ambulatory Visit: Payer: BC Managed Care – PPO | Admitting: Internal Medicine

## 2021-04-24 VITALS — BP 118/64 | HR 72 | Ht 71.0 in | Wt 143.2 lb

## 2021-04-24 DIAGNOSIS — I471 Supraventricular tachycardia: Secondary | ICD-10-CM | POA: Diagnosis not present

## 2021-04-24 NOTE — Progress Notes (Unsigned)
Enrolled patient for a 14 day Zio XT  monitor to be mailed to patients home  °

## 2021-04-24 NOTE — Progress Notes (Signed)
HPI Michelle Hodge returns today for ongoing followup of palpitations and SVT. She underwent EP study 3 years ago and had no inducible sustained SVT. She had been stable in the interim until a few months ago when she had Covid and since then has had worsening symptoms. She has obtained an Apple watch. She has a multitude of questions regarding her HR. She appears to have some SVT at 170/min. She has also had a lot of palpitations and feels frequently like her heart is about to start racing. Allergies  Allergen Reactions   Erythromycin Nausea And Vomiting     Current Outpatient Medications  Medication Sig Dispense Refill   Ascorbic Acid (VITAMIN C) 500 MG CAPS See admin instructions.     Cholecalciferol (VITAMIN D3) 50 MCG (2000 UT) capsule Take 2,000 Units by mouth daily.     Ferrous Sulfate (IRON PO) Take 65 mg by mouth daily.     metoprolol succinate (TOPROL-XL) 25 MG 24 hr tablet Take 25 mg by mouth daily.     vitamin C (ASCORBIC ACID) 500 MG tablet Take 500 mg by mouth daily.     zaleplon (SONATA) 10 MG capsule Take 10 mg by mouth at bedtime as needed for sleep.     No current facility-administered medications for this visit.     Past Medical History:  Diagnosis Date   Anemia, unspecified    Dermatitis herpetiformis    Female climacteric state    Hyperlipidemia    Insomnia    Interstitial cystitis    Menopausal state    Migraines    Nephrolithiasis    Osteopenia    Pelvic congestion syndrome    Sinusitis    Varicose veins of bilateral lower extremities with other complications    Varicose veins of both lower extremities with pain     ROS:   All systems reviewed and negative except as noted in the HPI.   Past Surgical History:  Procedure Laterality Date   CATARACT EXTRACTION  2018   laparascopy     LAPAROSCOPIC OVARIAN     SVT ABLATION N/A 03/21/2018   Procedure: SVT ABLATION;  Surgeon: Marinus Maw, MD;  Location: [facility];  Service:  Cardiovascular;  Laterality: N/A;   VEIN LIGATION AND STRIPPING     VITRECTOMY Bilateral 6/17-8/17     Family History  Problem Relation Age of Onset   Colon polyps Mother    Atrial fibrillation Mother    Hypertension Mother    Lymphoma Father    Colon polyps Father    Cancer Brother        BLADDER   Stroke Brother    Retinal detachment Brother    AAA (abdominal aortic aneurysm) Brother    Obesity Brother    Cancer Brother        CARCINOID   Diabetes Brother    Hypertension Brother      Social History   Socioeconomic History   Marital status: Significant Other    Spouse name: Not on file   Number of children: Not on file   Years of education: Not on file   Highest education level: Not on file  Occupational History   Not on file  Tobacco Use   Smoking status: Never   Smokeless tobacco: Never  Vaping Use   Vaping Use: Never used  Substance and Sexual Activity   Alcohol use: Yes    Comment: a couple glasses of wine or beer a week  Drug use: Never   Sexual activity: Not on file  Other Topics Concern   Not on file  Social History Narrative   Not on file   Social Determinants of Health   Financial Resource Strain: Not on file  Food Insecurity: Not on file  Transportation Needs: Not on file  Physical Activity: Not on file  Stress: Not on file  Social Connections: Not on file  Intimate Partner Violence: Not on file     BP 118/64    Pulse 72    Ht 5\' 11"  (1.803 m)    Wt 143 lb 3.2 oz (65 kg)    SpO2 98%    BMI 19.97 kg/m   Physical Exam:  Well appearing NAD HEENT: Unremarkable Neck:  No JVD, no thyromegally Lymphatics:  No adenopathy Back:  No CVA tenderness Lungs:  Clear with no wheezes HEART:  Regular rate rhythm, no murmurs, no rubs, no clicks Abd:  soft, positive bowel sounds, no organomegally, no rebound, no guarding Ext:  2 plus pulses, no edema, no cyanosis, no clubbing Skin:  No rashes no nodules Neuro:  CN II through XII intact, motor  grossly intact  EKG - NSR  Assess/Plan:  Palpitations - as she has had a negative EP study, I will have her wear a 14 day zio monitor. Followup will depend on the findings. I spent 30 minutes including over 50% face to face time on this visit.  Damondre Pfeifle,MD

## 2021-04-24 NOTE — Patient Instructions (Addendum)
Medication Instructions:  Your physician recommends that you continue on your current medications as directed. Please refer to the Current Medication list given to you today.  Labwork: None ordered.  Testing/Procedures: Your physician has recommended that you wear a holter monitor. Holter monitors are medical devices that record the hearts electrical activity. Doctors most often use these monitors to diagnose arrhythmias. Arrhythmias are problems with the speed or rhythm of the heartbeat. The monitor is a small, portable device. You can wear one while you do your normal daily activities. This is usually used to diagnose what is causing palpitations/syncope (passing out).  You will wear a 14 day heart monitor  Follow-Up: Your physician wants you to follow-up in: late January 2023 with Lewayne Bunting, MD  May 25, 2021 at 2:15 pm  Any Other Special Instructions Will Be Listed Below (If Applicable).  If you need a refill on your cardiac medications before your next appointment, please call your pharmacy.   Your physician has recommended that you wear a Zio monitor.   This monitor is a medical device that records the hearts electrical activity. Doctors most often use these monitors to diagnose arrhythmias. Arrhythmias are problems with the speed or rhythm of the heartbeat. The monitor is a small device applied to your chest. You can wear one while you do your normal daily activities. While wearing this monitor if you have any symptoms to push the button and record what you felt. Once you have worn this monitor for the period of time provider prescribed (Usually 14 days), you will return the monitor device in the postage paid box. Once it is returned they will download the data collected and provide [place] with a report which the provider will then review and we will call you with those results. Important tips:  Avoid showering during the first 24 hours of wearing the monitor. Avoid excessive  sweating to help maximize wear time. Do not submerge the device, no hot tubs, and no swimming pools. Keep any lotions or oils away from the patch. After 24 hours you may shower with the patch on. Take brief showers with your back facing the shower head.  Do not remove patch once it has been placed because that will interrupt data and decrease adhesive wear time. Push the button when you have any symptoms and write down what you were feeling. Once you have completed wearing your monitor, remove and place into box which has postage paid and place in your outgoing mailbox.  If for some reason you have misplaced your box then call our office and we can provide another box and/or mail it off for you.

## 2021-05-01 DIAGNOSIS — I471 Supraventricular tachycardia: Secondary | ICD-10-CM | POA: Diagnosis not present

## 2021-05-19 ENCOUNTER — Telehealth: Payer: Self-pay | Admitting: Internal Medicine

## 2021-05-19 NOTE — Telephone Encounter (Signed)
Joslyn with [employer] calling. Strip 11 page 10 noted SVT, 60 seconds heart 182 bpm episode was symptomatic with SOB, dizziness, lightheaded, bubbles in throat, burping and weakness in legs.   Full results are in Epic for viewing will forward to MD.

## 2021-05-19 NOTE — Telephone Encounter (Signed)
Calling to report abnormal zio patch result

## 2021-05-19 NOTE — Telephone Encounter (Signed)
Pt has appt to discuss results with GT on 05/25/2021.  No action needed.

## 2021-05-22 ENCOUNTER — Encounter: Payer: Self-pay | Admitting: Internal Medicine

## 2021-05-25 ENCOUNTER — Ambulatory Visit: Payer: BC Managed Care – PPO | Admitting: Internal Medicine

## 2021-06-22 ENCOUNTER — Other Ambulatory Visit: Payer: Self-pay

## 2021-06-22 ENCOUNTER — Ambulatory Visit: Payer: BC Managed Care – PPO | Admitting: Internal Medicine

## 2021-06-22 ENCOUNTER — Telehealth: Payer: Self-pay | Admitting: Internal Medicine

## 2021-06-22 DIAGNOSIS — R000 Tachycardia, unspecified: Secondary | ICD-10-CM

## 2021-06-22 MED ORDER — METOPROLOL SUCCINATE ER 25 MG PO TB24: 25.0000 mg | ORAL_TABLET | Freq: Every day | ORAL | 3 refills | Status: DC

## 2021-06-22 MED ORDER — FLECAINIDE ACETATE 50 MG PO TABS: 50.0000 mg | ORAL_TABLET | Freq: Two times a day (BID) | ORAL | 11 refills | Status: DC

## 2021-06-22 NOTE — Progress Notes (Signed)
HPI Michelle Hodge returns today for followup. She is a pleasant middle aged woman with a h/o palpitations who developed symptoms while on an exercise treadmill test several years ago. She underwent EP study but was non-inducible. Her symptoms have worsened over the past year such that most times when she exerts herself she will feel the sensation of rapid heart racing. She has worn a cardiac monitor which has demonstrated recurrent atrial tachycardia which is characterized has having different HR's and PR intervals suggestive of multiple foci. She notes that physical exertion and stress will bring on the symptoms which last seconds to minutes. She notes that she has family members with atrial fib.  Allergies  Allergen Reactions   Erythromycin Nausea And Vomiting     Current Outpatient Medications  Medication Sig Dispense Refill   Ascorbic Acid (VITAMIN C) 500 MG CAPS See admin instructions.     Cholecalciferol (VITAMIN D3) 50 MCG (2000 UT) capsule Take 2,000 Units by mouth daily.     Ferrous Sulfate (IRON PO) Take 65 mg by mouth daily.     flecainide (TAMBOCOR) 50 MG tablet Take 1 tablet (50 mg total) by mouth 2 (two) times daily. 60 tablet 11   metoprolol succinate (TOPROL XL) 25 MG 24 hr tablet Take 1 tablet (25 mg total) by mouth daily. 90 tablet 3   No current facility-administered medications for this visit.     Past Medical History:  Diagnosis Date   Anemia, unspecified    Dermatitis herpetiformis    Female climacteric state    Hyperlipidemia    Insomnia    Interstitial cystitis    Menopausal state    Migraines    Nephrolithiasis    Osteopenia    Pelvic congestion syndrome    Sinusitis    Varicose veins of bilateral lower extremities with other complications    Varicose veins of both lower extremities with pain     ROS:   All systems reviewed and negative except as noted in the HPI.   Past Surgical History:  Procedure Laterality Date   CATARACT EXTRACTION   2018   laparascopy     LAPAROSCOPIC OVARIAN     SVT ABLATION N/A 03/21/2018   Procedure: SVT ABLATION;  Surgeon: Marinus Maw, MD;  Location: [facility];  Service: Cardiovascular;  Laterality: N/A;   VEIN LIGATION AND STRIPPING     VITRECTOMY Bilateral 6/17-8/17     Family History  Problem Relation Age of Onset   Colon polyps Mother    Atrial fibrillation Mother    Hypertension Mother    Lymphoma Father    Colon polyps Father    Cancer Brother        BLADDER   Stroke Brother    Retinal detachment Brother    AAA (abdominal aortic aneurysm) Brother    Obesity Brother    Cancer Brother        CARCINOID   Diabetes Brother    Hypertension Brother      Social History   Socioeconomic History   Marital status: Significant Other    Spouse name: Not on file   Number of children: Not on file   Years of education: Not on file   Highest education level: Not on file  Occupational History   Not on file  Tobacco Use   Smoking status: Never   Smokeless tobacco: Never  Vaping Use   Vaping Use: Never used  Substance and Sexual Activity  Alcohol use: Yes    Comment: a couple glasses of wine or beer a week   Drug use: Never   Sexual activity: Not on file  Other Topics Concern   Not on file  Social History Narrative   Not on file   Social Determinants of Health   Financial Resource Strain: Not on file  Food Insecurity: Not on file  Transportation Needs: Not on file  Physical Activity: Not on file  Stress: Not on file  Social Connections: Not on file  Intimate Partner Violence: Not on file     BP 100/62    Pulse 74    Ht 5\' 11"  (1.803 m)    Wt 147 lb (66.7 kg)    SpO2 98%    BMI 20.50 kg/m   Physical Exam:  Well appearing woman, NAD HEENT: Unremarkable Neck:  No JVD, no thyromegally Lymphatics:  No adenopathy Back:  No CVA tenderness Lungs:  Clear with no wheezes HEART:  Regular rate rhythm, no murmurs, no rubs, no clicks Abd:  soft, positive bowel  sounds, no organomegally, no rebound, no guarding Ext:  2 plus pulses, no edema, no cyanosis, no clubbing Skin:  No rashes no nodules Neuro:  CN II through XII intact, motor grossly intact  Assess/Plan:  Worsening atrial tachycardia - I have discussed the mechanisms of her arrhythmia and that she has multiple foci based on the different rates and PR intervals in SVT. 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 I have discussed the treatment options in detail with the patient and her husband. I do not recommend repeat ablation as I suspect that her multiple foci would make success more difficult. I think initiation of flecainide would be reasonable. She will continue her beta blocker. I suspect that she will one day have atrial fib. She does not now but is at risk in the future. Many of her family have this.   Mackinze Criado,MD

## 2021-06-22 NOTE — Patient Instructions (Addendum)
Medication Instructions:  Your physician has recommended you make the following change in your medication:    START taking flecainide 50 mg-  Take one tablet by mouth twice a day  Labwork: None ordered.  Testing/Procedures: None ordered.  Follow-Up:  Nurse visit:  July 05, 2021 at 10:00 am for an EKG  Your physician wants you to follow-up in: 3 months with Michelle Peru, MD   Any Other Special Instructions Will Be Listed Below (If Applicable).  If you need a refill on your cardiac medications before your next appointment, please call your pharmacy.   Flecainide Tablets What is this medication? FLECAINIDE (FLEK a nide) prevents and treats a fast or irregular heartbeat (arrhythmia). It is often used to treat a type of arrhythmia known as AFib (atrial fibrillation). It works by slowing down overactive electric signals in the heart, which stabilizes your heart rhythm. It belongs to a group of medications called antiarrhythmics. This medicine may be used for other purposes; ask your health care provider or pharmacist if you have questions. COMMON BRAND NAME(S): Tambocor What should I tell my care team before I take this medication? They need to know if you have any of these conditions: Abnormal levels of potassium in the blood Heart disease including heart rhythm and heart rate problems Kidney or liver disease Recent heart attack An unusual or allergic reaction to flecainide, local anesthetics, other medications, foods, dyes, or preservatives Pregnant or trying to get pregnant Breast-feeding How should I use this medication? Take this medication by mouth with a glass of water. Follow the directions on the prescription label. You can take this medication with or without food. Take your doses at regular intervals. Do not take your medication more often than directed. Do not stop taking this medication suddenly. This may cause serious, heart-related side effects. If your care team wants  you to stop the medication, the dose may be slowly lowered over time to avoid any side effects. Talk to your care team regarding the use of this medication in children. While this medication may be prescribed for children as young as 1 year of age for selected conditions, precautions do apply. Overdosage: If you think you have taken too much of this medicine contact a poison control center or emergency room at once. NOTE: This medicine is only for you. Do not share this medicine with others. What if I miss a dose? If you miss a dose, take it as soon as you can. If it is almost time for your next dose, take only that dose. Do not take double or extra doses. What may interact with this medication? Do not take this medication with any of the following: Amoxapine Arsenic trioxide Certain antibiotics like clarithromycin, erythromycin, gatifloxacin, gemifloxacin, levofloxacin, moxifloxacin, sparfloxacin, or troleandomycin Certain antidepressants called tricyclic antidepressants like amitriptyline, imipramine, or nortriptyline Certain medications to control heart rhythm like disopyramide, encainide, moricizine, procainamide, propafenone, and quinidine Cisapride Delavirdine Droperidol Haloperidol [place]orn Imatinib Levomethadyl Maprotiline Medications for malaria like chloroquine and halofantrine Pentamidine Phenothiazines like chlorpromazine, mesoridazine, prochlorperazine, thioridazine Pimozide Quinine Ranolazine Ritonavir Sertindole This medication may also interact with the following: Cimetidine Dofetilide Medications for angina or high blood pressure Medications to control heart rhythm like amiodarone and digoxin Ziprasidone This list may not describe all possible interactions. Give your health care provider a list of all the medicines, herbs, non-prescription drugs, or dietary supplements you use. Also tell them if you smoke, drink alcohol, or use illegal drugs. Some items may  interact with your medicine.  What should I watch for while using this medication? Visit your care team for regular checks on your progress. Because your condition and the use of this medication carries some risk, it is a good idea to carry an identification card, necklace or bracelet with details of your condition, medications, and care team. Check your blood pressure and pulse rate regularly. Ask your care team what your blood pressure and pulse rate should be, and when you should contact them. Your care team also may schedule regular blood tests and electrocardiograms to check your progress. You may get drowsy or dizzy. Do not drive, use machinery, or do anything that needs mental alertness until you know how this medication affects you. Do not stand or sit up quickly, especially if you are an older patient. This reduces the risk of dizzy or fainting spells. Alcohol can make you more dizzy, increase flushing and rapid heartbeats. Avoid alcoholic drinks. What side effects may I notice from receiving this medication? Side effects that you should report to your care team as soon as possible: Allergic reactions--skin rash, itching, hives, swelling of the face, lips, tongue, or throat Heart failure--shortness of breath, swelling of the ankles, feet, or hands, sudden weight gain, unusual weakness or fatigue Heart rhythm changes--fast or irregular heartbeat, dizziness, feeling faint or lightheaded, chest pain, trouble breathing Liver injury--right upper belly pain, loss of appetite, nausea, light-colored stool, dark yellow or brown urine, yellowing skin or eyes, unusual weakness or fatigue Side effects that usually do not require medical attention (report to your care team if they continue or are bothersome): Blurry vision Constipation Dizziness Fatigue Headache Nausea Tremors or shaking This list may not describe all possible side effects. Call your doctor for medical advice about side effects. You  may report side effects to FDA at 1-800-FDA-1088. Where should I keep my medication? Keep out of the reach of children and pets. Store at room temperature between 15 and 30 degrees C (59 and 86 degrees F). Protect from light. Keep container tightly closed. Throw away any unused medication after the expiration date. NOTE: This sheet is a summary. It may not cover all possible information. If you have questions about this medicine, talk to your doctor, pharmacist, or health care provider.  2022 Elsevier/Gold Standard (2020-06-17 00:00:00)

## 2021-06-22 NOTE — Telephone Encounter (Signed)
New Message:     Patient said she just saw Dr Ladona Ridgel today. She says she needs to talk to the nurse about her medicine.

## 2021-06-22 NOTE — Telephone Encounter (Signed)
Returned call to Pt.  She had removed metoprolol succinate from her med list today at her visit.  She is asking if Dr. Ladona Ridgel wants her to continue this medication.  Advised he did.  Sent new prescription to pharmacy.

## 2021-06-23 ENCOUNTER — Encounter: Payer: Self-pay | Admitting: Internal Medicine

## 2021-07-04 ENCOUNTER — Telehealth: Payer: Self-pay | Admitting: Internal Medicine

## 2021-07-04 NOTE — Telephone Encounter (Signed)
Patient needs to reschedule her Nurse Visit scheduled for 07/05/21

## 2021-07-05 ENCOUNTER — Ambulatory Visit: Payer: BC Managed Care – PPO

## 2021-07-11 ENCOUNTER — Telehealth: Payer: Self-pay

## 2021-07-11 ENCOUNTER — Ambulatory Visit (INDEPENDENT_AMBULATORY_CARE_PROVIDER_SITE_OTHER): Payer: BC Managed Care – PPO

## 2021-07-11 ENCOUNTER — Other Ambulatory Visit: Payer: Self-pay

## 2021-07-11 VITALS — HR 54

## 2021-07-11 DIAGNOSIS — I471 Supraventricular tachycardia: Secondary | ICD-10-CM

## 2021-07-11 MED ORDER — FLECAINIDE ACETATE 50 MG PO TABS: 75.0000 mg | ORAL_TABLET | Freq: Two times a day (BID) | ORAL | 11 refills | Status: DC

## 2021-07-11 NOTE — Patient Instructions (Signed)
Medication Instructions:  ?Your physician recommends that you continue on your current medications as directed. Please refer to the Current Medication list given to you today. ? ?Labwork: ?None ordered. ? ?Testing/Procedures: ?None ordered. ? ?Follow-Up: ?Your physician wants you to follow-up Sep 29, 2021 at 4:15 pm with Dr. Lovena Le ? ?Any Other Special Instructions Will Be Listed Below (If Applicable). ? ?If you need a refill on your cardiac medications before your next appointment, please call your pharmacy.  ? ? ? ? ?

## 2021-07-11 NOTE — Telephone Encounter (Signed)
Discussed results of nurse visit with GT. ? ?Per Dr. Lovena Le: ? ? Increase your flecainide to 75 mg by mouth twice a day (that will be 1.5 tablets of your current prescription.  I also sent in 30 more days for you) ?He said to give the flecainide some more time to work.  He said your symptoms should subside in the coming weeks. ?He wants another EKG in 2 weeks ? ? ? ?MyChart message sent with above.  Await response. ?

## 2021-07-11 NOTE — Progress Notes (Signed)
? ?  Nurse Visit  ?  ?Date of Encounter: 07/11/2021 ?ID: Michelle Hodge, DOB [birth year], MRN [id number] ? ?PCP:  Dois Davenport, MD ?  ?Electrophysiologist:  Dr. Lewayne Bunting ?  { ? ?Visit Details  ? ?VS:  pulse 54 ? ?Reason for visit: Worsening atrial tachycardia - I have discussed the mechanisms of her arrhythmia and that she has multiple foci based on the different rates and PR intervals in SVT.  NEW START flecainide. ?Performed today: EKG ?Changes (medications, testing, etc.) : none ?Length of Visit: 30 minutes ? ?Per patient she has had some side effects from the flecainide.  She states she has had dizziness, a feeling of instability in her legs, headache.  She states she can still function, but just does not feel as well since starting flecainide.   ? ?EKG was reviewed by DOD.   ? ?Will discuss symptoms with Dr. Ladona Ridgel and advise patient if any changes may be suggested.   ? ?Patient also has some additional questions for Dr. Ladona Ridgel that she will submit via MyChart. ? ?Signed, ?Wiliam Ke, RN  ?07/11/2021 7:22 AM ? ? ?  ?

## 2021-07-11 NOTE — Telephone Encounter (Signed)
Per Pt-she is agreeable to medication change. ? ?New prescription sent to pharmacy. ? ?Nurse visit scheduled.  ? ?Await further needs. ?

## 2021-07-26 ENCOUNTER — Other Ambulatory Visit: Payer: Self-pay

## 2021-07-26 ENCOUNTER — Ambulatory Visit (INDEPENDENT_AMBULATORY_CARE_PROVIDER_SITE_OTHER): Payer: BC Managed Care – PPO

## 2021-07-26 VITALS — HR 52

## 2021-07-26 DIAGNOSIS — R000 Tachycardia, unspecified: Secondary | ICD-10-CM

## 2021-07-26 MED ORDER — FLECAINIDE ACETATE 150 MG PO TABS: 75.0000 mg | ORAL_TABLET | Freq: Two times a day (BID) | ORAL | 3 refills | Status: DC

## 2021-07-26 NOTE — Patient Instructions (Signed)
Follow up as scheduled.  

## 2021-07-26 NOTE — Progress Notes (Signed)
? ?  Nurse Visit  ?  ?Date of Encounter: 07/26/2021 ?ID: Michelle Hodge, DOB April 13, 1961, MRN [id number] ? ?PCP:  Hayden Rasmussen, MD ?  ?Cardiologist:  None  ? ?Electrophysiologist:  Cristopher Peru. MD  ?   ?Visit Details  ? ?VS:  Pulse (!) 52   ?Wt Readings from Last 3 Encounters:  ?06/22/21 147 lb (66.7 kg)  ?04/24/21 143 lb 3.2 oz (65 kg)  ?10/16/18 146 lb (66.2 kg)  ?  ? ?Reason for visit:  EKG for flecainide increase ?Performed today: Vitals, EKG, Provider consulted:EKG reviewed by DOD, and Education ?Changes (medications, testing, etc.) : None ?Length of Visit: 30 minutes ? ?Medications Adjustments/Labs and Tests Ordered: ?Orders Placed This Encounter  ?Procedures  ? EKG 12-Lead  ? ?Meds ordered this encounter  ?Medications  ? flecainide (TAMBOCOR) 150 MG tablet  ?  Sig: Take 0.5 tablets (75 mg total) by mouth 2 (two) times daily.  ?  Dispense:  90 tablet  ?  Refill:  3  ? ?Patient returns for repeat EKG after increasing flecainide to 75 mg PO BID.  Pt states she has been doing better with decreasing side effects from flecainide, less dizziness and feeling of being unsteady.  She also states she has not noticed any inappropriate tachycardia on this increased dose.  She is concerned about a stabbing pain in her head that is sometimes followed by a dull headache.  She states she has a history of migraines, but this feels different.  Advised this nurse would discuss with pharmacy.  Will discuss further with pharmacy and Dr. Lovena Le.  EKG reviewed by DOD and stable. ?Signed, ?Damian Leavell, RN  ?07/26/2021 4:10 PM ? ? ? ? ?

## 2021-07-27 DIAGNOSIS — Z5181 Encounter for therapeutic drug level monitoring: Secondary | ICD-10-CM

## 2021-07-27 DIAGNOSIS — R000 Tachycardia, unspecified: Secondary | ICD-10-CM

## 2021-07-31 ENCOUNTER — Telehealth: Payer: Self-pay

## 2021-07-31 DIAGNOSIS — I471 Supraventricular tachycardia: Secondary | ICD-10-CM

## 2021-07-31 NOTE — Telephone Encounter (Signed)
Please sign attestation ?

## 2021-08-24 ENCOUNTER — Ambulatory Visit (INDEPENDENT_AMBULATORY_CARE_PROVIDER_SITE_OTHER): Payer: BC Managed Care – PPO

## 2021-08-24 DIAGNOSIS — Z79899 Other long term (current) drug therapy: Secondary | ICD-10-CM

## 2021-08-24 DIAGNOSIS — Z5181 Encounter for therapeutic drug level monitoring: Secondary | ICD-10-CM

## 2021-08-24 DIAGNOSIS — R000 Tachycardia, unspecified: Secondary | ICD-10-CM | POA: Diagnosis not present

## 2021-08-24 LAB — EXERCISE TOLERANCE TEST
Angina Index: 0
Base ST Depression (mm): 0 mm
Duke Treadmill Score: -1
Estimated workload: 10.1
Exercise duration (min): 9 min
Exercise duration (sec): 0 s
MPHR: 161 {beats}/min
Peak HR: 126 {beats}/min
Percent HR: 78 %
RPE: 16
Rest HR: 61 {beats}/min
ST Depression (mm): 2 mm

## 2021-09-29 ENCOUNTER — Encounter: Payer: Self-pay | Admitting: Internal Medicine

## 2021-09-29 ENCOUNTER — Ambulatory Visit (INDEPENDENT_AMBULATORY_CARE_PROVIDER_SITE_OTHER): Payer: BC Managed Care – PPO | Admitting: Internal Medicine

## 2021-09-29 DIAGNOSIS — I471 Supraventricular tachycardia: Secondary | ICD-10-CM | POA: Diagnosis not present

## 2021-09-29 MED ORDER — METOPROLOL SUCCINATE ER 25 MG PO TB24: 12.5000 mg | ORAL_TABLET | Freq: Every day | ORAL | 3 refills | Status: DC

## 2021-09-29 NOTE — Patient Instructions (Addendum)
Medication Instructions:  Your physician has recommended you make the following change in your medication:    REDUCE your Toprol 25 mg-  Take 1/2 tablet (12.5 mg) by mouth daily.  Labwork: None ordered.  Testing/Procedures: None ordered.  Follow-Up: Your physician wants you to follow-up in: 6 months with Lewayne Bunting, MD   March 22, 2022 at 3:45 pm   Any Other Special Instructions Will Be Listed Below (If Applicable).  Dr. Nita Sickle, neurology  If you need a refill on your cardiac medications before your next appointment, please call your pharmacy.   Important Information About Sugar

## 2021-09-29 NOTE — Progress Notes (Signed)
HPI Michelle Hodge returns today for followup of her multiple atrial arrhythmias. She is a pleasant 61 yo woman with palpitations due to multiple different atrial arrhythmias. She note significant symptoms with exertion. She has had a nice response to flecainide 75 mg twice daily. She is able to walk without symptoms. She notes HA's which she has had before but which are worse with her flecainide and beta blocker. She also feels tired at times.  Allergies  Allergen Reactions   Erythromycin Nausea And Vomiting     Current Outpatient Medications  Medication Sig Dispense Refill   Ascorbic Acid (VITAMIN C) 500 MG CAPS See admin instructions.     Cholecalciferol (VITAMIN D3) 50 MCG (2000 UT) capsule Take 2,000 Units by mouth daily.     Ferrous Sulfate (IRON PO) Take 65 mg by mouth daily.     flecainide (TAMBOCOR) 150 MG tablet Take 0.5 tablets (75 mg total) by mouth 2 (two) times daily. 90 tablet 3   metoprolol succinate (TOPROL XL) 25 MG 24 hr tablet Take 0.5 tablets (12.5 mg total) by mouth daily. 45 tablet 3   No current facility-administered medications for this visit.     Past Medical History:  Diagnosis Date   Anemia, unspecified    Dermatitis herpetiformis    Female climacteric state    Hyperlipidemia    Insomnia    Interstitial cystitis    Menopausal state    Migraines    Nephrolithiasis    Osteopenia    Pelvic congestion syndrome    Sinusitis    Varicose veins of bilateral lower extremities with other complications    Varicose veins of both lower extremities with pain     ROS:   All systems reviewed and negative except as noted in the HPI.   Past Surgical History:  Procedure Laterality Date   CATARACT EXTRACTION  2018   laparascopy     LAPAROSCOPIC OVARIAN     SVT ABLATION N/A 03/21/2018   Procedure: SVT ABLATION;  Surgeon: Marinus Maw, MD;  Location: [facility];  Service: Cardiovascular;  Laterality: N/A;   VEIN LIGATION AND STRIPPING      VITRECTOMY Bilateral 6/17-8/17     Family History  Problem Relation Age of Onset   Colon polyps Mother    Atrial fibrillation Mother    Hypertension Mother    Lymphoma Father    Colon polyps Father    Cancer Brother        BLADDER   Stroke Brother    Retinal detachment Brother    AAA (abdominal aortic aneurysm) Brother    Obesity Brother    Cancer Brother        CARCINOID   Diabetes Brother    Hypertension Brother      Social History   Socioeconomic History   Marital status: Significant Other    Spouse name: Not on file   Number of children: Not on file   Years of education: Not on file   Highest education level: Not on file  Occupational History   Not on file  Tobacco Use   Smoking status: Never   Smokeless tobacco: Never  Vaping Use   Vaping Use: Never used  Substance and Sexual Activity   Alcohol use: Yes    Comment: a couple glasses of wine or beer a week   Drug use: Never   Sexual activity: Not on file  Other Topics Concern   Not on file  Social  History Narrative   Not on file   Social Determinants of Health   Financial Resource Strain: Not on file  Food Insecurity: Not on file  Transportation Needs: Not on file  Physical Activity: Not on file  Stress: Not on file  Social Connections: Not on file  Intimate Partner Violence: Not on file     BP (!) 90/56   Pulse (!) 58   Ht 5\' 11"  (1.803 m)   Wt 147 lb (66.7 kg)   SpO2 97%   BMI 20.50 kg/m   Physical Exam:  Well appearing NAD HEENT: Unremarkable Neck:  No JVD, no thyromegally Lymphatics:  No adenopathy Back:  No CVA tenderness Lungs:  Clear with no wheezes HEART:  Regular rate rhythm, no murmurs, no rubs, no clicks Abd:  soft, positive bowel sounds, no organomegally, no rebound, no guarding Ext:  2 plus pulses, no edema, no cyanosis, no clubbing Skin:  No rashes no nodules Neuro:  CN II through XII intact, motor grossly intact  EKG - sinus at 58/min  Assess/Plan:  Atrial  arrhythmias - she is much improved on flecainide. See below. Might need to consider a switch to rhythmol if HA's worsen Low bp - her bp runs low. I asked her to reduce the dose of toprol to 1/2 tab of 25 mg daily. HA's - unclear if this is due to flecainide. I'll refer her to Dr. if the HA's are not better with a reduction in the beta blocker.  Sinus node dysfunction - her Hr's at rest are low. Reducing her dose of beta blocker may help.  Michelle Katz Dareld Mcauliffe,MD

## 2021-10-19 ENCOUNTER — Encounter: Payer: Self-pay | Admitting: Internal Medicine

## 2021-12-21 IMAGING — MG MM DIGITAL SCREENING BILAT W/ TOMO AND CAD
5 series · 6 of 13 positions shown · non-contrast
Comparison: Previous exam(s).

CLINICAL DATA: Screening.

EXAM:
DIGITAL SCREENING BILATERAL MAMMOGRAM WITH TOMOSYNTHESIS AND CAD
TECHNIQUE: Bilateral screening digital craniocaudal and mediolateral oblique
mammograms were obtained. Bilateral screening digital breast
tomosynthesis was performed. The images were evaluated with
computer-aided detection.

[R MLO synth-2D]
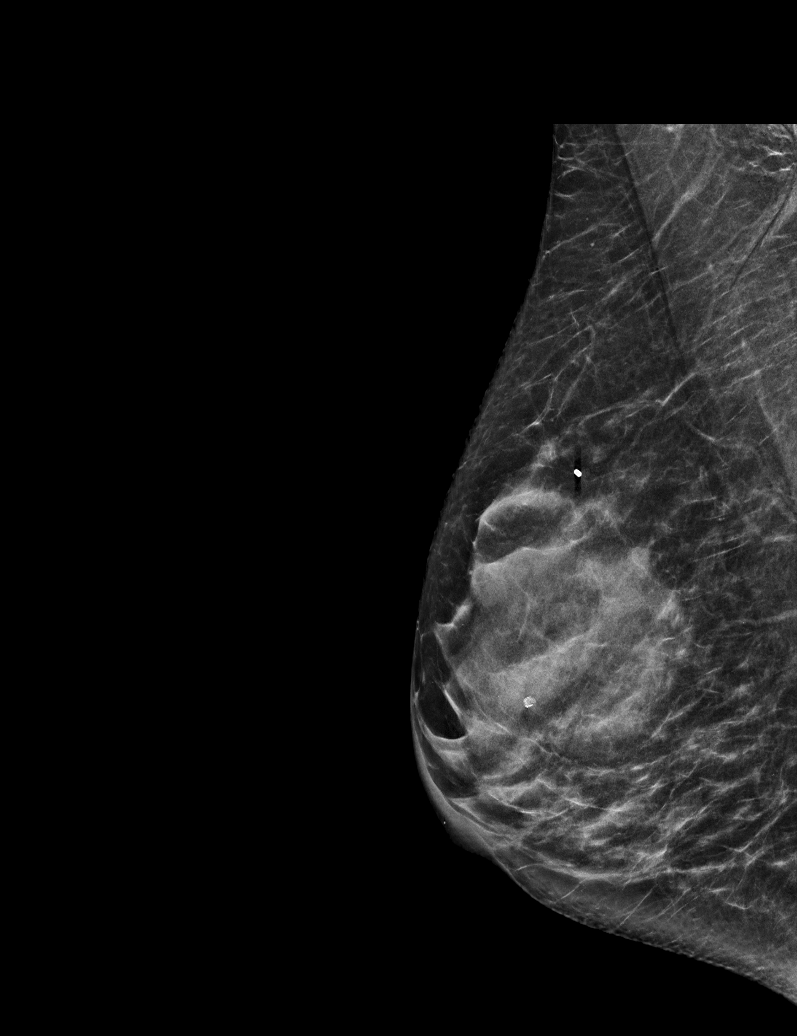

[L CC synth-2D]
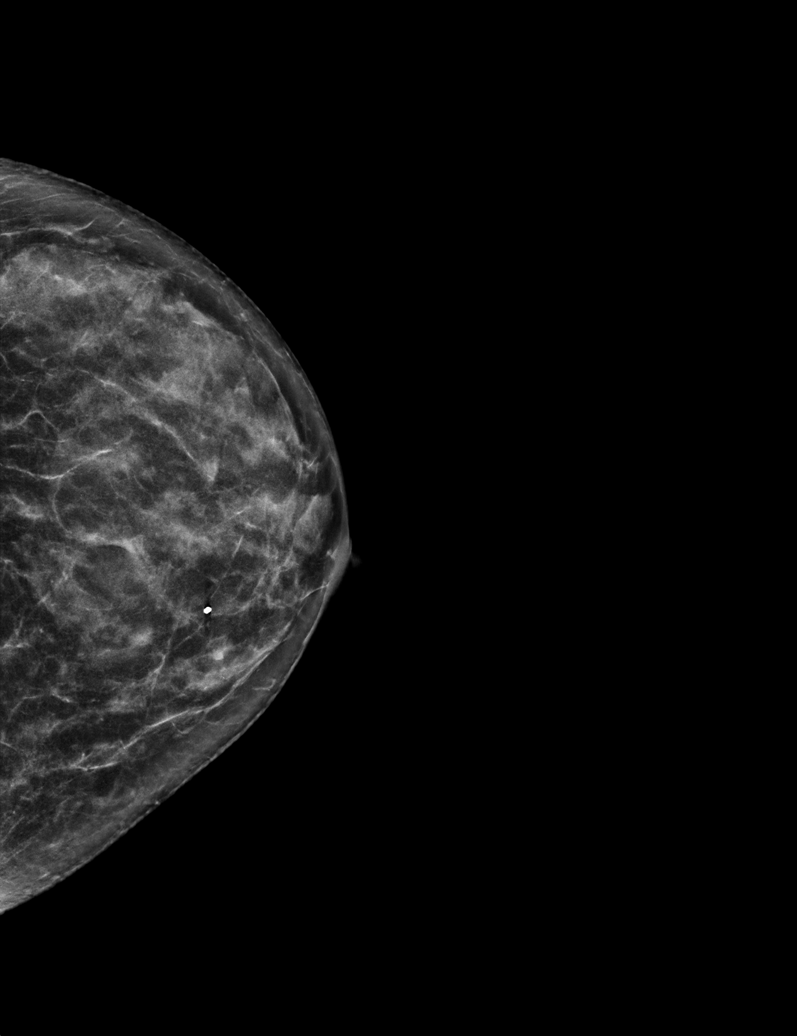

[L MLO synth-2D]
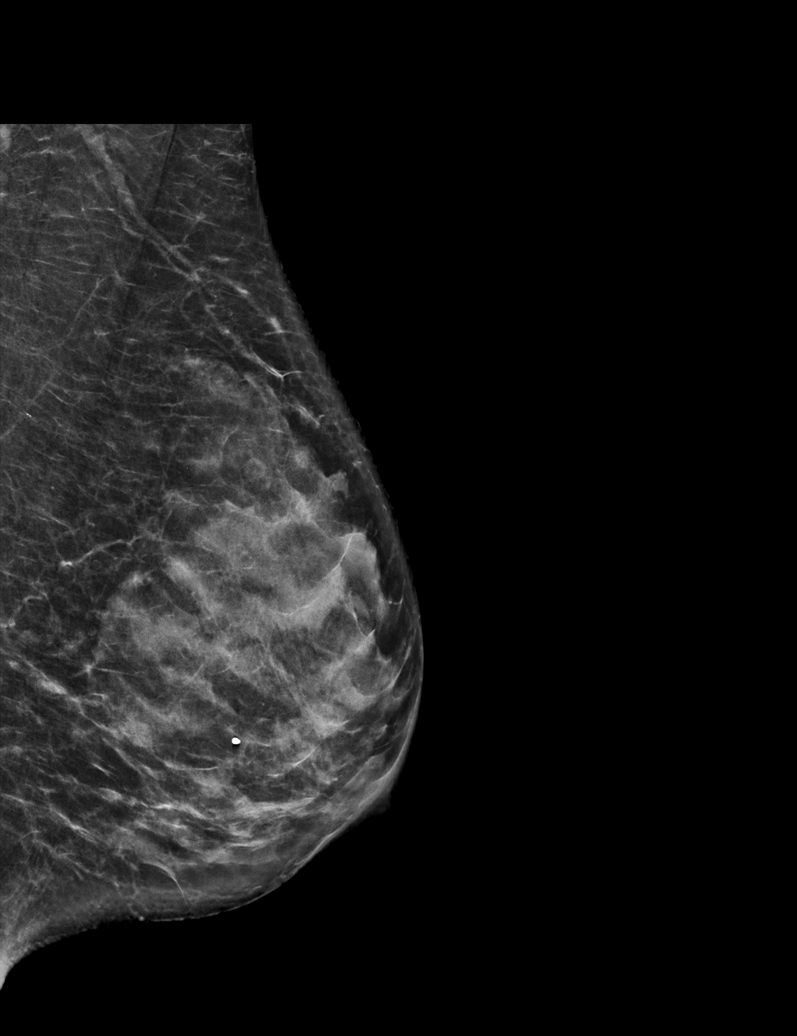

[L MLO tomo · 2 of 61 frames shown]
[frame 20/61]
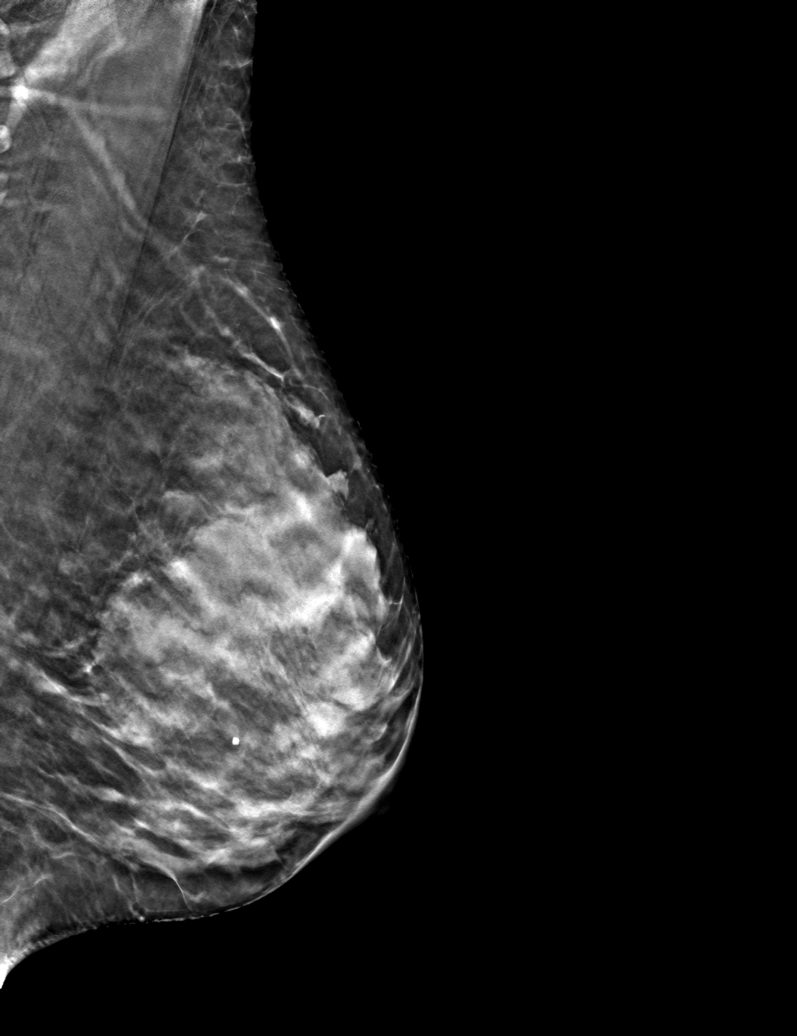
[frame 31/61]
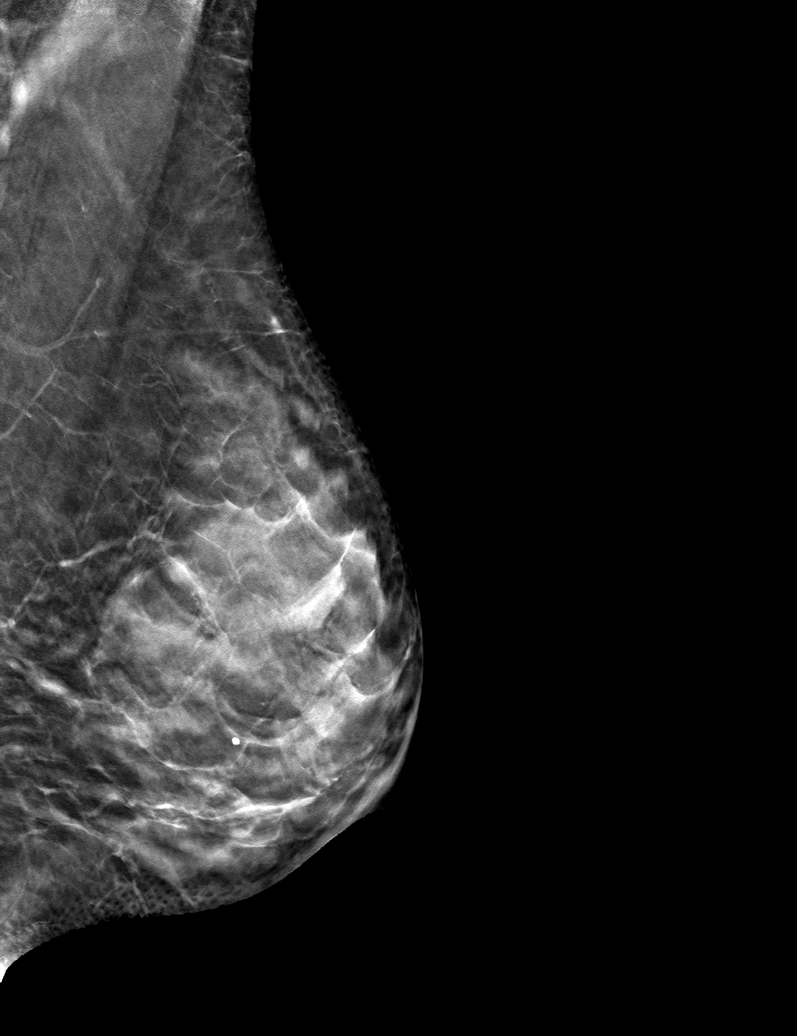

[L CC tomo · tomo slice 29/57.0]
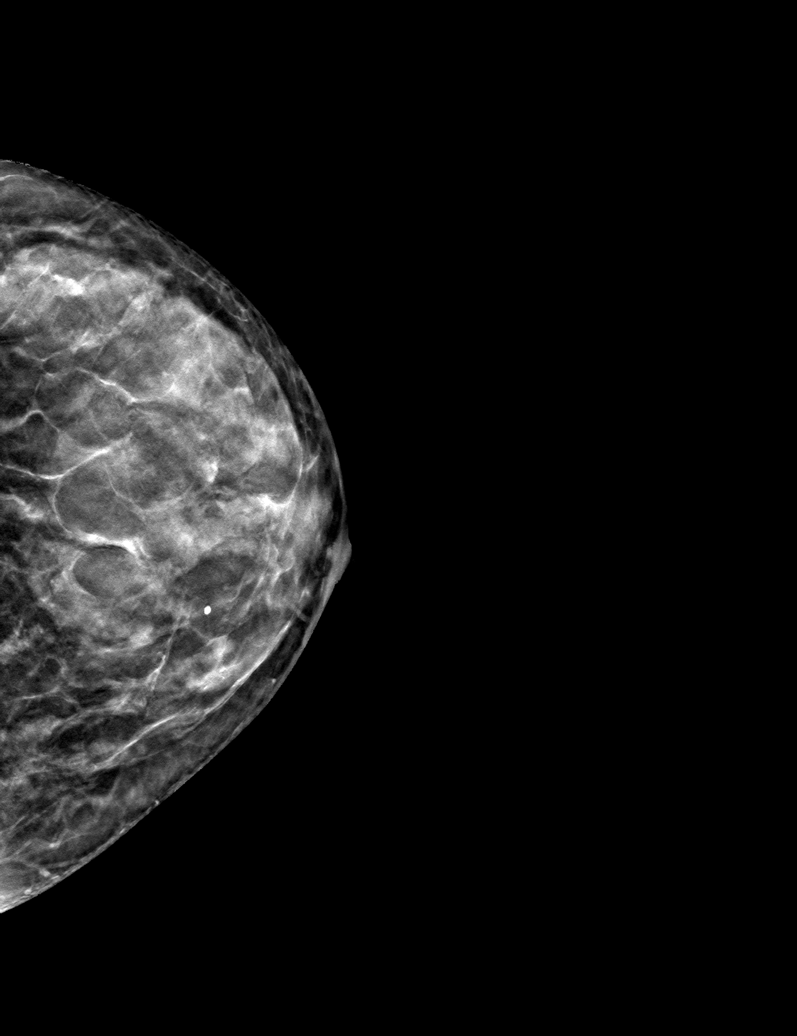

[6 of 13 positions shown; findings below may reference images not displayed]

ACR Breast Density Category d: The breast tissue is extremely dense,
which lowers the sensitivity of mammography
FINDINGS: There are no findings suspicious for malignancy.
IMPRESSION: No mammographic evidence of malignancy. A result letter of this
screening mammogram will be mailed directly to the patient.

RECOMMENDATION:
Screening mammogram in one year. (Code:TA-V-WV9)

BI-RADS CATEGORY  1: Negative.

## 2022-03-22 ENCOUNTER — Ambulatory Visit: Payer: BC Managed Care – PPO | Admitting: Internal Medicine

## 2022-05-29 ENCOUNTER — Encounter: Payer: Self-pay | Admitting: Internal Medicine

## 2022-05-29 ENCOUNTER — Ambulatory Visit: Payer: BC Managed Care – PPO | Attending: Internal Medicine | Admitting: Internal Medicine

## 2022-05-29 VITALS — BP 114/58 | HR 66 | Ht 70.5 in | Wt 152.0 lb

## 2022-05-29 DIAGNOSIS — I959 Hypotension, unspecified: Secondary | ICD-10-CM | POA: Diagnosis not present

## 2022-05-29 DIAGNOSIS — I4719 Other supraventricular tachycardia: Secondary | ICD-10-CM | POA: Diagnosis not present

## 2022-05-29 MED ORDER — FLECAINIDE ACETATE 150 MG PO TABS: 75.0000 mg | ORAL_TABLET | Freq: Two times a day (BID) | ORAL | 3 refills | Status: DC

## 2022-05-29 NOTE — Progress Notes (Signed)
HPI [name] returns today for followup of her multiple atrial arrhythmias. She is a pleasant 62 yo woman with palpitations due to multiple different atrial arrhythmias. She note significant symptoms with exertion. She has had a nice response to flecainide 75 mg twice daily. She is able to walk with minimal symptoms. She notes HA's which she has had before but which are worse with her flecainide and beta blocker. She also feels tired at times.  She has an Apple watch which shows excursions of her heart rates up over 150 but she denies much of any sustained at least documented SVT.  Allergies  Allergen Reactions   Erythromycin Nausea And Vomiting     Current Outpatient Medications  Medication Sig Dispense Refill   Ascorbic Acid (VITAMIN C) 500 MG CAPS See admin instructions.     Cholecalciferol (VITAMIN D3) 50 MCG (2000 UT) capsule Take 2,000 Units by mouth daily.     Ferrous Sulfate (IRON PO) Take 65 mg by mouth daily.     flecainide (TAMBOCOR) 150 MG tablet Take 0.5 tablets (75 mg total) by mouth 2 (two) times daily. 90 tablet 3   gabapentin (NEURONTIN) 300 MG capsule Take 300-600 mg by mouth at bedtime.     methocarbamol (ROBAXIN) 750 MG tablet Take 750 mg by mouth every 6 (six) hours.     metoprolol succinate (TOPROL XL) 25 MG 24 hr tablet Take 0.5 tablets (12.5 mg total) by mouth daily. 45 tablet 3   NURTEC 75 MG TBDP Take 1 tablet by mouth daily as needed.     No current facility-administered medications for this visit.     Past Medical History:  Diagnosis Date   Anemia, unspecified    Dermatitis herpetiformis    Female climacteric state    Hyperlipidemia    Insomnia    Interstitial cystitis    Menopausal state    Migraines    Nephrolithiasis    Osteopenia    Pelvic congestion syndrome    Sinusitis    Varicose veins of bilateral lower extremities with other complications    Varicose veins of both lower extremities with pain     ROS:   All systems reviewed  and negative except as noted in the HPI.   Past Surgical History:  Procedure Laterality Date   CATARACT EXTRACTION  2018   laparascopy     LAPAROSCOPIC OVARIAN     SVT ABLATION N/A 03/21/2018   Procedure: SVT ABLATION;  Surgeon: Evans Lance, MD;  Location: [facility];  Service: Cardiovascular;  Laterality: N/A;   VEIN LIGATION AND STRIPPING     VITRECTOMY Bilateral 6/17-8/17     Family History  Problem Relation Age of Onset   Colon polyps Mother    Atrial fibrillation Mother    Hypertension Mother    Lymphoma Father    Colon polyps Father    Cancer Brother        BLADDER   Stroke Brother    Retinal detachment Brother    AAA (abdominal aortic aneurysm) Brother    Obesity Brother    Cancer Brother        CARCINOID   Diabetes Brother    Hypertension Brother      Social History   Socioeconomic History   Marital status: Significant Other    Spouse name: Not on file   Number of children: Not on file   Years of education: Not on file   Highest education level: Not  on file  Occupational History   Not on file  Tobacco Use   Smoking status: Never   Smokeless tobacco: Never  Vaping Use   Vaping Use: Never used  Substance and Sexual Activity   Alcohol use: Yes    Comment: a couple glasses of wine or beer a week   Drug use: Never   Sexual activity: Not on file  Other Topics Concern   Not on file  Social History Narrative   Not on file   Social Determinants of Health   Financial Resource Strain: Not on file  Food Insecurity: Not on file  Transportation Needs: Not on file  Physical Activity: Not on file  Stress: Not on file  Social Connections: Not on file  Intimate Partner Violence: Not on file     BP (!) 114/58   Pulse 66   Ht 5' 10.5" (1.791 m)   Wt 152 lb (68.9 kg)   SpO2 99%   BMI 21.50 kg/m   Physical Exam:  Well appearing NAD HEENT: Unremarkable Neck:  No JVD, no thyromegally Lymphatics:  No adenopathy Back:  No CVA  tenderness Lungs:  Clear with no wheezes HEART:  Regular rate rhythm, no murmurs, no rubs, no clicks Abd:  soft, positive bowel sounds, no organomegally, no rebound, no guarding Ext:  2 plus pulses, no edema, no cyanosis, no clubbing Skin:  No rashes no nodules Neuro:  CN II through XII intact, motor grossly intact  EKG - nsr with IRBBB  Assess/Plan:  Atrial arrhythmias - she is much improved on flecainide. See below. Might need to consider a switch to rhythmol if HA's worsen. I asked her to take an extra half tablet of flecainide for recurrent sustained palpitations. Low bp - her bp runs low. I asked her to continue the dose of toprol to 1/2 tab of 25 mg daily and increase her salt intake. HA's - unclear if this is due to flecainide. I'll refer her to Dr. Posey Pronto if the HA's are not better with a reduction in the beta blocker.  Sinus node dysfunction - her Hr's at rest are low. Reducing her dose of beta blocker may help.   [name],MD

## 2022-05-29 NOTE — Patient Instructions (Signed)
Medication Instructions:  Your physician has recommended you make the following change in your medication: AS NEEDED change: Flecainide   You will: Take 0.5 tablets (75 mg total) by mouth 2 (two) times daily. Take an extra half tablet by mouth, for worsening palpitations, AS NEEDED ONLY   Lab Work: None ordered.  If you have labs (blood work) drawn today and your tests are completely normal, you will receive your results only by: [place] (if you have MyChart) OR A paper copy in the mail If you have any lab test that is abnormal or we need to change your treatment, we will call you to review the results.  Testing/Procedures: None ordered.  Follow-Up: At [facility], you and your health needs are our priority.  As part of our continuing mission to provide you with exceptional heart care, we have created designated Provider Care Teams.  These Care Teams include your primary Cardiologist (physician) and Advanced Practice Providers (APPs -  Physician Assistants and Nurse Practitioners) who all work together to provide you with the care you need, when you need it.  We recommend signing up for the patient portal called "MyChart".  Sign up information is provided on this After Visit Summary.  MyChart is used to connect with patients for Virtual Visits (Telemedicine).  Patients are able to view lab/test results, encounter notes, upcoming appointments, etc.  Non-urgent messages can be sent to your provider as well.   To learn more about what you can do with MyChart, go to [email or web address].    Your next appointment:   Please schedule a 6 month follow up appointment with Dr. Cristopher Peru.    The format for your next appointment:   In Person  Provider:   Cristopher Peru, MD{or one of the following Advanced Practice Providers on your designated Care Team:   Tommye Standard, [place] Legrand Como "Jonni Sanger" Chalmers Cater, [place]   Important Information About Sugar

## 2022-10-17 ENCOUNTER — Other Ambulatory Visit: Payer: Self-pay | Admitting: Internal Medicine

## 2023-06-01 ENCOUNTER — Other Ambulatory Visit: Payer: Self-pay | Admitting: Internal Medicine

## 2023-06-27 ENCOUNTER — Other Ambulatory Visit: Payer: Self-pay | Admitting: Internal Medicine

## 2023-08-10 ENCOUNTER — Other Ambulatory Visit: Payer: Self-pay | Admitting: Internal Medicine

## 2023-08-29 ENCOUNTER — Other Ambulatory Visit: Payer: Self-pay | Admitting: Family Medicine

## 2023-08-29 DIAGNOSIS — Z1231 Encounter for screening mammogram for malignant neoplasm of breast: Secondary | ICD-10-CM

## 2023-08-30 ENCOUNTER — Other Ambulatory Visit: Payer: Self-pay | Admitting: Internal Medicine

## 2023-09-02 ENCOUNTER — Other Ambulatory Visit: Payer: Self-pay

## 2023-09-02 MED ORDER — METOPROLOL SUCCINATE ER 25 MG PO TB24: 12.5000 mg | ORAL_TABLET | Freq: Every day | ORAL | 0 refills | Status: DC

## 2023-09-03 ENCOUNTER — Ambulatory Visit
Admission: RE | Admit: 2023-09-03 | Discharge: 2023-09-03 | Disposition: A | Discharge status: Home / Self Care | Payer: Self-pay | Source: Ambulatory Visit | Attending: Family Medicine | Admitting: Family Medicine

## 2023-09-03 DIAGNOSIS — Z1231 Encounter for screening mammogram for malignant neoplasm of breast: Secondary | ICD-10-CM

## 2023-09-05 ENCOUNTER — Other Ambulatory Visit: Payer: Self-pay | Admitting: Internal Medicine

## 2023-09-17 ENCOUNTER — Other Ambulatory Visit: Payer: Self-pay | Admitting: Internal Medicine

## 2023-09-18 ENCOUNTER — Telehealth: Payer: Self-pay | Admitting: Internal Medicine

## 2023-09-18 ENCOUNTER — Other Ambulatory Visit: Payer: Self-pay | Admitting: Internal Medicine

## 2023-09-18 MED ORDER — FLECAINIDE ACETATE 150 MG PO TABS: ORAL_TABLET | ORAL | 0 refills | Status: DC

## 2023-09-18 NOTE — Telephone Encounter (Signed)
 RX sent to requested Pharmacy

## 2023-09-18 NOTE — Telephone Encounter (Signed)
*  STAT* If patient is at the pharmacy, call can be transferred to refill team.   1. Which medications need to be refilled? (please list name of each medication and dose if known) flecainide  (TAMBOCOR ) 150 MG tablet   2. Which pharmacy/location (including street and city if local pharmacy) is medication to be sent to? [id number] [place], [place] - [address] AT [address] [facility] [address]   3. Do they need a 30 day or 90 day supply?   Patient is completely out of medication. She will need enough to last her until 6/26 appointment with Michaelle Adolphus, PA.

## 2023-10-18 ENCOUNTER — Telehealth: Payer: Self-pay | Admitting: Internal Medicine

## 2023-10-18 MED ORDER — METOPROLOL SUCCINATE ER 25 MG PO TB24: 12.5000 mg | ORAL_TABLET | Freq: Every day | ORAL | 0 refills | Status: DC

## 2023-10-18 NOTE — Telephone Encounter (Signed)
*  STAT* If patient is at the pharmacy, call can be transferred to refill team.   1. Which medications need to be refilled? (please list name of each medication and dose if known)  metoprolol  succinate (TOPROL -XL) 25 MG 24 hr tablet  2. Which pharmacy/location (including street and city if local pharmacy) is medication to be sent to? [id number] [place], [place] - [address] AT [address] [facility] [address]    3. Do they need a 30 day or 90 day supply?  30 day supply  Patient says she has been completely out of medication for about 5 days.

## 2023-10-18 NOTE — Telephone Encounter (Signed)
 Pt's medication was sent to pt's pharmacy as requested. Confirmation received.

## 2023-10-29 NOTE — Progress Notes (Unsigned)
  Electrophysiology Office Note:   Date:  10/31/2023  ID:  Michelle Hodge, DOB November 27, 1960, MRN [id number]  Primary Cardiologist: None Electrophysiologist: Fonda Kitty, MD (transfer from Dr. Waddell, pt request)     History of Present Illness:   Michelle Hodge is a 63 y.o. female with h/o atrial arryhtmias, HAs, and hypotension seen today for routine electrophysiology followup.   Since last being seen in our clinic the patient reports doing OK.    She has had gradually increasing palpitations. Feels like her heart is flopping around in her chest when she lies down at night, and HR gets up > 120 with activity. Does report one episode of syncope, went from her knees on the ground to standing up and had a white out, isn't sure how long she was out. Occurred about a month ago. Sometimes forgets her metoprolol  and has more palpitations there next day.  Otherwise, denies chest pain or dyspnea.   Has R>L swelling in the setting of varicose veins.   Review of systems complete and found to be negative unless listed in HPI.   EP Information / Studies Reviewed:    EKG is ordered today. Personal review as below.  EKG Interpretation Date/Time:  Thursday October 31 2023 08:51:54 EDT Ventricular Rate:  73 PR Interval:  198 QRS Duration:  90 QT Interval:  392 QTC Calculation: 431 R Axis:   74  Text Interpretation: Normal sinus rhythm Nonspecific ST abnormality No previous ECGs available Poor baseline Confirmed by Lesia Sharper [phone]) on 10/31/2023 9:00:41 AM    Arrhythmia/Device History No specialty comments available.   Physical Exam:   VS:  BP 100/64   Pulse 73   Ht 5' 10.5 (1.791 m)   Wt 144 lb 3.2 oz (65.4 kg)   SpO2 98%   BMI 20.40 kg/m    Wt Readings from Last 3 Encounters:  10/31/23 144 lb 3.2 oz (65.4 kg)  05/29/22 152 lb (68.9 kg)  09/29/21 147 lb (66.7 kg)     GEN: No acute distress NECK: No JVD; No carotid bruits CARDIAC: Regular rate and rhythm, no murmurs, rubs,  gallops RESPIRATORY:  Clear to auscultation without rales, wheezing or rhonchi  ABDOMEN: Soft, non-tender, non-distended EXTREMITIES:  No edema; No deformity   ASSESSMENT AND PLAN:    Atrial arrhythmias; AT and SVT EKG today shows NSR with poor baseline Continue flecainide  75 mg BID Continue toprol  12.5 mg daily Increase palpitations and manual EKGs via apple watch with concern for AF  Will place 2 week monitor and follow up after for discussions and treatment options.  Hypotension Follow on current regimen  SND Follow.   Follow up with Sharper Prentice Lesia in 4-6 weeks.    Signed, Sharper Prentice Lesia, PA-C

## 2023-10-31 ENCOUNTER — Encounter: Payer: Self-pay | Admitting: Student

## 2023-10-31 ENCOUNTER — Ambulatory Visit: Attending: Student | Admitting: Student

## 2023-10-31 ENCOUNTER — Ambulatory Visit

## 2023-10-31 VITALS — BP 100/64 | HR 73 | Ht 70.5 in | Wt 144.2 lb

## 2023-10-31 DIAGNOSIS — I959 Hypotension, unspecified: Secondary | ICD-10-CM

## 2023-10-31 DIAGNOSIS — I471 Supraventricular tachycardia, unspecified: Secondary | ICD-10-CM

## 2023-10-31 DIAGNOSIS — R002 Palpitations: Secondary | ICD-10-CM

## 2023-10-31 DIAGNOSIS — I4719 Other supraventricular tachycardia: Secondary | ICD-10-CM

## 2023-10-31 NOTE — Patient Instructions (Signed)
 Medication Instructions:  Your physician recommends that you continue on your current medications as directed. Please refer to the Current Medication list given to you today.  *If you need a refill on your cardiac medications before your next appointment, please call your pharmacy*  Lab Work: None ordered If you have labs (blood work) drawn today and your tests are completely normal, you will receive your results only by: MyChart Message (if you have MyChart) OR A paper copy in the mail If you have any lab test that is abnormal or we need to change your treatment, we will call you to review the results.  Testing/Procedures: GEOFFRY HEWS- Long Term Monitor Instructions  Your physician has requested you wear a ZIO patch monitor for 14 days.  This is a single patch monitor. [employer] supplies one patch monitor per enrollment. Additional stickers are not available. Please do not apply patch if you will be having a Nuclear Stress Test,  Echocardiogram, Cardiac CT, MRI, or Chest Xray during the period you would be wearing the  monitor. The patch cannot be worn during these tests. You cannot remove and re-apply the  ZIO XT patch monitor.  Your ZIO patch monitor will be mailed 3 day USPS to your address on file. It may take 3-5 days  to receive your monitor after you have been enrolled.  Once you have received your monitor, please review the enclosed instructions. Your monitor  has already been registered assigning a specific monitor serial # to you.  Billing and Patient Assistance Program Information  We have supplied [employer] with any of your insurance information on file for billing purposes. [employer] offers a sliding scale Patient Assistance Program for patients that do not have  insurance, or whose insurance does not completely cover the cost of the ZIO monitor.  You must apply for the Patient Assistance Program to qualify for this discounted rate.  To apply, please call [employer] at [phone],  select option 4, select option 2, ask to apply for  Patient Assistance Program. Meredeth will ask your household income, and how many people  are in your household. They will quote your out-of-pocket cost based on that information.  [employer] will also be able to set up a 61-month, interest-free payment plan if needed.  Applying the monitor   Shave hair from upper left chest.  Hold abrader disc by orange tab. Rub abrader in 40 strokes over the upper left chest as  indicated in your monitor instructions.  Clean area with 4 enclosed alcohol pads. Let dry.  Apply patch as indicated in monitor instructions. Patch will be placed under collarbone on left  side of chest with arrow pointing upward.  Rub patch adhesive wings for 2 minutes. Remove white label marked 1. Remove the white  label marked 2. Rub patch adhesive wings for 2 additional minutes.  While looking in a mirror, press and release button in center of patch. A small green light will  flash 3-4 times. This will be your only indicator that the monitor has been turned on.  Do not shower for the first 24 hours. You may shower after the first 24 hours.  Press the button if you feel a symptom. You will hear a small click. Record Date, Time and  Symptom in the Patient Logbook.  When you are ready to remove the patch, follow instructions on the last 2 pages of Patient  Logbook. Stick patch monitor onto the last page of Patient Logbook.  Place Patient Logbook in the  blue and white box. Use locking tab on box and tape box closed  securely. The blue and white box has prepaid postage on it. Please place it in the mailbox as  soon as possible. Your physician should have your test results approximately 7 days after the  monitor has been mailed back to [facility].  Call [facility] Customer Care at [phone] if you have questions regarding  your ZIO XT patch monitor. Call them immediately if you see an orange light blinking on your   monitor.  If your monitor falls off in less than 4 days, contact our Monitor department at [phone].  If your monitor becomes loose or falls off after 4 days call [employer] at [phone] for  suggestions on securing your monitor   Follow-Up: At [facility], you and your health needs are our priority.  As part of our continuing mission to provide you with exceptional heart care, our providers are all part of one team.  This team includes your primary Cardiologist (physician) and Advanced Practice Providers or APPs (Physician Assistants and Nurse Practitioners) who all work together to provide you with the care you need, when you need it.  Your next appointment:   4-6 week(s)  Provider:   Ozell Jodie Passey, PA-C

## 2023-10-31 NOTE — Progress Notes (Unsigned)
 Enrolled for [employer] to mail a ZIO XT long term holter monitor to the patients address on file.   Dr. Daneil Dunker to read.

## 2023-11-17 ENCOUNTER — Other Ambulatory Visit: Payer: Self-pay | Admitting: Internal Medicine

## 2023-11-18 NOTE — Telephone Encounter (Signed)
*  STAT* If patient is at the pharmacy, call can be transferred to refill team.   1. Which medications need to be refilled? (please list name of each medication and dose if known)  Metoprolol    2. Would you like to learn more about the convenience, safety, & potential cost savings by using the [facility]?     3. Are you open to using the [facility] (Type [facility].   4. Which pharmacy/location (including street and city if local pharmacy) is medication to be sent to?[facility] [address] [facility] [address] [place]  5. Do they need a 30 day or 90 day supply? Need 30 until her appointment on 11-22-23 please call  today- out of medicine

## 2023-11-20 NOTE — Progress Notes (Unsigned)
  Electrophysiology Office Note:   Date:  11/22/2023  ID:  Michelle Hodge, DOB 1960-09-16, MRN [id number]  Primary Cardiologist: None Electrophysiologist: Fonda Kitty, MD      History of Present Illness:   Michelle Hodge is a 63 y.o. female with h/o atrial arryhtmias, HAs, and hypotension  seen today for routine electrophysiology followup.   Seen last month with palpitations and tachycardia. Monitor placed.  Since last being seen in our clinic the patient reports doing OK. Still having palpitations, worse at night and with exercise.  Mont[place] as below showed PACs, PVCs, and occasional AT/SVT. Otherwise, she denies chest pain, dyspnea, PND, orthopnea, nausea, vomiting, dizziness, syncope, edema, weight gain, or early satiety.   Review of systems complete and found to be negative unless listed in HPI.   EP Information / Studies Reviewed:    EKG is not ordered today. EKG from 10/31/2023 reviewed which showed NSR in 70s       Arrhythmia/Device History No specialty comments available.  Monitor 10/2023 HR - 39 to 174 bpm Avg HR of 69 bpm.  Predominant underlying rhythm was Sinus Rhythm.  First Degree AV Block was present.  Slight P wave morphology changes were noted.  102 Supraventricular Tachycardia runs occurred Fastest 174 bpm x 6 beats Longest 68m41secs @ 92 bpm  Some episodes of Supraventricular Tachycardia may be possible Atrial Tachycardia with variable block.  Supraventricular  Tachycardia was detected within +/- 45 seconds of symptomatic patient event(s). Rare PACs <1% Rare PVCs <1%  Physical Exam:   VS:  BP 104/60 (BP Location: Left Arm, Patient Position: Sitting, Cuff Size: Normal)   Pulse 68   Ht 5' 10.5 (1.791 m)   Wt 141 lb (64 kg)   SpO2 98%   BMI 19.95 kg/m    Wt Readings from Last 3 Encounters:  11/22/23 141 lb (64 kg)  10/31/23 144 lb 3.2 oz (65.4 kg)  05/29/22 152 lb (68.9 kg)     GEN: No acute distress NECK: No JVD; No carotid bruits CARDIAC:  Regular rate and rhythm, no murmurs, rubs, gallops RESPIRATORY:  Clear to auscultation without rales, wheezing or rhonchi  ABDOMEN: Soft, non-tender, non-distended EXTREMITIES:  No edema; No deformity   ASSESSMENT AND PLAN:    Atrial arrhythmias; AT and SVT Monitor showed SVT, AT, and sinus tachycardia related to symptoms as above. PACs/PVCs also felt at times.  Continue flecainide  75 mg BID Continue toprol  12.5 mg daily Previously had hypotension on higher dose of toprol  Overall satisfied with her control and does not want to increase meds.  Does not want to consider redo ablation at this time. Not-inducible 03/2018.   Hypotension ? Dysautonomia Follow on current regimen She has chronic hypotension and tachycardia with minimal exertion, suspect dysautonomic component.   We discussed the role of salt and water repletion, the importance of exercise, often needing to be started in the recumbent position, and the awareness of triggers and the role of ambient heat and dehydration.  Rehydration solutions include Liquid IV, NUUN, TriOral, Normralyte pedialyte advanced care and Banana Bags.    Discussed the importance of compression wear, specifically thigh or abdominal compression. This can be accomplished separately or with combined thigh/pelvic/abdominal compression wear such as Spanx.     SND Follow  Follow up with EP Team in 6 months  Signed, Ozell Prentice Passey, PA-C

## 2023-11-22 ENCOUNTER — Ambulatory Visit: Attending: Student | Admitting: Student

## 2023-11-22 ENCOUNTER — Encounter: Payer: Self-pay | Admitting: Student

## 2023-11-22 VITALS — BP 104/60 | HR 68 | Ht 70.5 in | Wt 141.0 lb

## 2023-11-22 DIAGNOSIS — I959 Hypotension, unspecified: Secondary | ICD-10-CM | POA: Diagnosis not present

## 2023-11-22 DIAGNOSIS — R002 Palpitations: Secondary | ICD-10-CM

## 2023-11-22 DIAGNOSIS — I4719 Other supraventricular tachycardia: Secondary | ICD-10-CM

## 2023-11-22 DIAGNOSIS — I471 Supraventricular tachycardia, unspecified: Secondary | ICD-10-CM | POA: Diagnosis not present

## 2023-11-22 MED ORDER — METOPROLOL SUCCINATE ER 25 MG PO TB24: 12.5000 mg | ORAL_TABLET | Freq: Every day | ORAL | 3 refills | Status: DC

## 2023-11-22 MED ORDER — FLECAINIDE ACETATE 150 MG PO TABS: ORAL_TABLET | ORAL | 3 refills | Status: AC

## 2023-11-22 NOTE — Patient Instructions (Signed)
 Medication Instructions:  Your physician recommends that you continue on your current medications as directed. Please refer to the Current Medication list given to you today.  *If you need a refill on your cardiac medications before your next appointment, please call your pharmacy*  Lab Work: None ordered If you have labs (blood work) drawn today and your tests are completely normal, you will receive your results only by: MyChart Message (if you have MyChart) OR A paper copy in the mail If you have any lab test that is abnormal or we need to change your treatment, we will call you to review the results.  Follow-Up: At [facility], you and your health needs are our priority.  As part of our continuing mission to provide you with exceptional heart care, our providers are all part of one team.  This team includes your primary Cardiologist (physician) and Advanced Practice Providers or APPs (Physician Assistants and Nurse Practitioners) who all work together to provide you with the care you need, when you need it.  Your next appointment:   6 month(s)  Provider:   Ardeen Kohler, MD

## 2024-02-12 ENCOUNTER — Telehealth: Payer: Self-pay

## 2024-02-12 MED ORDER — METOPROLOL SUCCINATE ER 25 MG PO TB24: 25.0000 mg | ORAL_TABLET | Freq: Every day | ORAL | 3 refills | Status: AC

## 2024-02-12 NOTE — Telephone Encounter (Signed)
 MC sent to patient advising toprol  script has been updated to 25 mg daily.
